# Patient Record
Sex: Female | Born: 1985 | Race: White | Hispanic: No | Marital: Single | Smoking: Never smoker
Health system: Southern US, Community
[De-identification: ages and names within clinical notes are randomized; demographics above are authoritative.]

## PROBLEM LIST (undated history)

## (undated) DIAGNOSIS — F329 Major depressive disorder, single episode, unspecified: Secondary | ICD-10-CM

## (undated) DIAGNOSIS — F419 Anxiety disorder, unspecified: Secondary | ICD-10-CM

## (undated) DIAGNOSIS — F32A Depression, unspecified: Secondary | ICD-10-CM

## (undated) DIAGNOSIS — E282 Polycystic ovarian syndrome: Secondary | ICD-10-CM

## (undated) DIAGNOSIS — D649 Anemia, unspecified: Secondary | ICD-10-CM

## (undated) DIAGNOSIS — J189 Pneumonia, unspecified organism: Secondary | ICD-10-CM

## (undated) DIAGNOSIS — G4730 Sleep apnea, unspecified: Secondary | ICD-10-CM

## (undated) DIAGNOSIS — M797 Fibromyalgia: Secondary | ICD-10-CM

## (undated) DIAGNOSIS — R51 Headache: Secondary | ICD-10-CM

## (undated) DIAGNOSIS — R519 Headache, unspecified: Secondary | ICD-10-CM

## (undated) DIAGNOSIS — J45909 Unspecified asthma, uncomplicated: Secondary | ICD-10-CM

## (undated) DIAGNOSIS — E039 Hypothyroidism, unspecified: Secondary | ICD-10-CM

## (undated) HISTORY — PX: DILATION AND CURETTAGE OF UTERUS: SHX78

## (undated) HISTORY — PX: TONSILLECTOMY: SUR1361

## (undated) HISTORY — DX: Sleep apnea, unspecified: G47.30

## (undated) HISTORY — PX: OTHER SURGICAL HISTORY: SHX169

---

## 2016-05-26 ENCOUNTER — Emergency Department (HOSPITAL_COMMUNITY)
Admission: EM | Admit: 2016-05-26 | Discharge: 2016-05-26 | Disposition: A | Discharge status: Home / Self Care | Payer: Managed Care, Other (non HMO) | Attending: Emergency Medicine | Admitting: Emergency Medicine

## 2016-05-26 ENCOUNTER — Encounter (HOSPITAL_COMMUNITY): Payer: Self-pay | Admitting: *Deleted

## 2016-05-26 ENCOUNTER — Emergency Department (HOSPITAL_COMMUNITY): Payer: Managed Care, Other (non HMO)

## 2016-05-26 DIAGNOSIS — J45909 Unspecified asthma, uncomplicated: Secondary | ICD-10-CM | POA: Insufficient documentation

## 2016-05-26 DIAGNOSIS — N390 Urinary tract infection, site not specified: Secondary | ICD-10-CM | POA: Insufficient documentation

## 2016-05-26 DIAGNOSIS — R1032 Left lower quadrant pain: Secondary | ICD-10-CM | POA: Diagnosis present

## 2016-05-26 DIAGNOSIS — R102 Pelvic and perineal pain: Secondary | ICD-10-CM

## 2016-05-26 HISTORY — DX: Unspecified asthma, uncomplicated: J45.909

## 2016-05-26 HISTORY — DX: Polycystic ovarian syndrome: E28.2

## 2016-05-26 LAB — COMPREHENSIVE METABOLIC PANEL
ALK PHOS: 67 U/L (ref 38–126)
ALT: 23 U/L (ref 14–54)
ANION GAP: 5 (ref 5–15)
AST: 23 U/L (ref 15–41)
Albumin: 3.5 g/dL (ref 3.5–5.0)
BILIRUBIN TOTAL: 0.4 mg/dL (ref 0.3–1.2)
BUN: 9 mg/dL (ref 6–20)
CALCIUM: 9.1 mg/dL (ref 8.9–10.3)
CO2: 27 mmol/L (ref 22–32)
CREATININE: 0.92 mg/dL (ref 0.44–1.00)
Chloride: 106 mmol/L (ref 101–111)
Glucose, Bld: 112 mg/dL — ABNORMAL HIGH (ref 65–99)
Potassium: 3.8 mmol/L (ref 3.5–5.1)
Sodium: 138 mmol/L (ref 135–145)
TOTAL PROTEIN: 6.3 g/dL — AB (ref 6.5–8.1)

## 2016-05-26 LAB — CBC
HCT: 40.5 % (ref 36.0–46.0)
HEMOGLOBIN: 13.5 g/dL (ref 12.0–15.0)
MCH: 27.8 pg (ref 26.0–34.0)
MCHC: 33.3 g/dL (ref 30.0–36.0)
MCV: 83.5 fL (ref 78.0–100.0)
PLATELETS: 258 10*3/uL (ref 150–400)
RBC: 4.85 MIL/uL (ref 3.87–5.11)
RDW: 13.6 % (ref 11.5–15.5)
WBC: 7.9 10*3/uL (ref 4.0–10.5)

## 2016-05-26 LAB — URINALYSIS, ROUTINE W REFLEX MICROSCOPIC
Bilirubin Urine: NEGATIVE
Glucose, UA: NEGATIVE mg/dL
HGB URINE DIPSTICK: NEGATIVE
Ketones, ur: NEGATIVE mg/dL
NITRITE: NEGATIVE
PROTEIN: NEGATIVE mg/dL
Specific Gravity, Urine: 1.019 (ref 1.005–1.030)
pH: 5.5 (ref 5.0–8.0)

## 2016-05-26 LAB — URINE MICROSCOPIC-ADD ON

## 2016-05-26 LAB — POC URINE PREG, ED: PREG TEST UR: NEGATIVE

## 2016-05-26 LAB — LIPASE, BLOOD: Lipase: 22 U/L (ref 11–51)

## 2016-05-26 MED ORDER — KETOROLAC TROMETHAMINE 30 MG/ML IJ SOLN
30.0000 mg | Freq: Once | INTRAMUSCULAR | Status: AC
  Administered 2016-05-26: 30 mg via INTRAMUSCULAR
  Filled 2016-05-26: qty 1

## 2016-05-26 MED ORDER — NAPROXEN 500 MG PO TABS: 500.0000 mg | ORAL_TABLET | Freq: Two times a day (BID) | ORAL | Status: DC

## 2016-05-26 MED ORDER — CEPHALEXIN 500 MG PO CAPS: 500.0000 mg | ORAL_CAPSULE | Freq: Two times a day (BID) | ORAL | Status: DC

## 2016-05-26 NOTE — ED Provider Notes (Signed)
CSN: [id number]     Arrival date & time 05/26/16  1454 History   First MD Initiated Contact with Patient 05/26/16 1608     Chief Complaint  Patient presents with  . Abdominal Pain   (Consider location/radiation/quality/duration/timing/severity/associated sxs/prior Treatment) HPI 30 y.o. female with a hx of PCOS, presents to the Emergency Department today complaining of left sided abdominal pain/pelvic pain with onset this afternoon around lunch time. Pt states that she was sitting comfortably when she felt the pain. States pain feels like a cramping sensation that is pulsating in the LLQ. Pain is 10/10. No relief with OTC remedies. Pt states that "it feels like my ovary exploded." No hx of the same. Recently finished clomid due to infertility. Pt LMP 2 weeks ago. No vaginal bleeding currently. No discharge. No dysuria. No CP/SOB. No N/V/D. No headaches. No fevers. No other symptoms noted.    Past Medical History  Diagnosis Date  . Asthma   . Polycystic ovary    History reviewed. No pertinent past surgical history. No family history on file. Social History  Substance Use Topics  . Smoking status: Never Smoker   . Smokeless tobacco: None  . Alcohol Use: No   OB History    No data available     Review of Systems ROS reviewed and all are negative for acute change except as noted in the HPI.  Allergies  Review of patient's allergies indicates no known allergies.  Home Medications   Prior to Admission medications   Not on File   BP 147/92 mmHg  Pulse 70  Temp(Src) 98 F (36.7 C) (Oral)  Resp 18  Ht 5\' 7"  (1.702 m)  Wt 125.465 kg  BMI 43.31 kg/m2  SpO2 97%  LMP 05/26/2016   Physical Exam  Constitutional: She is oriented to person, place, and time. She appears well-developed and well-nourished.  HENT:  Head: Normocephalic and atraumatic.  Eyes: EOM are normal. Pupils are equal, round, and reactive to light.  Neck: Normal range of motion. Neck supple. No tracheal  deviation present.  Cardiovascular: Normal rate, regular rhythm, normal heart sounds and intact distal pulses.   No murmur heard. Pulmonary/Chest: Effort normal and breath sounds normal. No respiratory distress. She has no wheezes. She has no rales. She exhibits no tenderness.  Abdominal: Soft. Normal appearance and bowel sounds are normal. There is no tenderness. There is no rigidity, no rebound, no guarding, no tenderness at McBurney's point and negative Murphy's sign.  Abdomen soft.   Musculoskeletal: Normal range of motion.  Neurological: She is alert and oriented to person, place, and time.  Skin: Skin is warm and dry.  Psychiatric: She has a normal mood and affect. Her behavior is normal. Thought content normal.  Nursing note and vitals reviewed.  ED Course  Procedures (including critical care time) Labs Review Labs Reviewed  COMPREHENSIVE METABOLIC PANEL - Abnormal; Notable for the following:    Glucose, Bld 112 (*)    Total Protein 6.3 (*)    All other components within normal limits  LIPASE, BLOOD  CBC  URINALYSIS, ROUTINE W REFLEX MICROSCOPIC (NOT AT [facility])  POC URINE PREG, ED   Imaging Review [place] Transvaginal Non-ob  05/26/2016  CLINICAL DATA:  Pelvic pain for 3 days. EXAM: TRANSABDOMINAL AND TRANSVAGINAL ULTRASOUND OF PELVIS DOPPLER ULTRASOUND OF OVARIES TECHNIQUE: Both transabdominal and transvaginal ultrasound examinations of the pelvis were performed. Transabdominal technique was performed for global imaging of the pelvis including uterus, ovaries, adnexal regions, and pelvic cul-de-sac. It  was necessary to proceed with endovaginal exam following the transabdominal exam to visualize the endometrium and ovaries. Color and duplex Doppler ultrasound was utilized to evaluate blood flow to the ovaries. COMPARISON:  None. FINDINGS: Uterus Measurements: 8.1 x 4.4 x 5.6 cm. No fibroids or other mass visualized. Endometrium Thickness: 12.2 mm.  No focal abnormality visualized. Right  ovary Measurements: 5.1 x 3.2 x 4.2 cm. Contains multiple cysts/follicles. Left ovary Measurements: 3.6 x 3.6 x 3.1 cm. Contains a 2.7 cm follicle. Pulsed Doppler evaluation of both ovaries demonstrates normal low-resistance arterial and venous waveforms. Other findings No abnormal free fluid. IMPRESSION: Multiple follicles in the right ovary and a single 2.7 cm follicle in the left ovary. No acute abnormalities. Electronically Signed   By: Gerome Sam III M.D   On: 05/26/2016 18:30   US Pelvis Complete  05/26/2016  CLINICAL DATA:  Pelvic pain for 3 days. EXAM: TRANSABDOMINAL AND TRANSVAGINAL ULTRASOUND OF PELVIS DOPPLER ULTRASOUND OF OVARIES TECHNIQUE: Both transabdominal and transvaginal ultrasound examinations of the pelvis were performed. Transabdominal technique was performed for global imaging of the pelvis including uterus, ovaries, adnexal regions, and pelvic cul-de-sac. It was necessary to proceed with endovaginal exam following the transabdominal exam to visualize the endometrium and ovaries. Color and duplex Doppler ultrasound was utilized to evaluate blood flow to the ovaries. COMPARISON:  None. FINDINGS: Uterus Measurements: 8.1 x 4.4 x 5.6 cm. No fibroids or other mass visualized. Endometrium Thickness: 12.2 mm.  No focal abnormality visualized. Right ovary Measurements: 5.1 x 3.2 x 4.2 cm. Contains multiple cysts/follicles. Left ovary Measurements: 3.6 x 3.6 x 3.1 cm. Contains a 2.7 cm follicle. Pulsed Doppler evaluation of both ovaries demonstrates normal low-resistance arterial and venous waveforms. Other findings No abnormal free fluid. IMPRESSION: Multiple follicles in the right ovary and a single 2.7 cm follicle in the left ovary. No acute abnormalities. Electronically Signed   By: Gerome Sam III M.D   On: 05/26/2016 18:30   [place] Art/ven Flow Abd Pelv Doppler  05/26/2016  CLINICAL DATA:  Pelvic pain for 3 days. EXAM: TRANSABDOMINAL AND TRANSVAGINAL ULTRASOUND OF PELVIS DOPPLER  ULTRASOUND OF OVARIES TECHNIQUE: Both transabdominal and transvaginal ultrasound examinations of the pelvis were performed. Transabdominal technique was performed for global imaging of the pelvis including uterus, ovaries, adnexal regions, and pelvic cul-de-sac. It was necessary to proceed with endovaginal exam following the transabdominal exam to visualize the endometrium and ovaries. Color and duplex Doppler ultrasound was utilized to evaluate blood flow to the ovaries. COMPARISON:  None. FINDINGS: Uterus Measurements: 8.1 x 4.4 x 5.6 cm. No fibroids or other mass visualized. Endometrium Thickness: 12.2 mm.  No focal abnormality visualized. Right ovary Measurements: 5.1 x 3.2 x 4.2 cm. Contains multiple cysts/follicles. Left ovary Measurements: 3.6 x 3.6 x 3.1 cm. Contains a 2.7 cm follicle. Pulsed Doppler evaluation of both ovaries demonstrates normal low-resistance arterial and venous waveforms. Other findings No abnormal free fluid. IMPRESSION: Multiple follicles in the right ovary and a single 2.7 cm follicle in the left ovary. No acute abnormalities. Electronically Signed   By: Gerome Sam III M.D   On: 05/26/2016 18:30   I have personally reviewed and evaluated these images and lab results as part of my medical decision-making.   EKG Interpretation None      MDM  I have reviewed and evaluated the relevant laboratory values I have reviewed and evaluated the relevant imaging studies.   I have reviewed the relevant previous healthcare records. I obtained HPI from historian.  ED Course:  Assessment: Pt is a 29yF with hx PCOS who presents with abdominal/pelvic pain this afternoon in LLQ. Pt concern for ovary. No vaginal bleeding/discharge. No fevers. LMP 2 weeks ago. On exam, pt in NAD. Nontoxic/nonseptic appearing. VSS. Afebrile. Lungs CTA. Heart RRR. Abdomen nontender soft. Concern for Torsion. Ultrasound unremarkable. No Torsion. Multiple follicles on right ovary with since follicle on left  measuring 2.7cm. Labs unremarkable. No leukocytosis. Pregnancy negative. UA with WBC and bacteria. Will treat for UTI since symptomatic. Possible mature follicle with ovulation? Given analgesia in ED. Plan is to DC home with follow up to GYN. At time of discharge, Patient is in no acute distress. Vital Signs are stable. Patient is able to ambulate. Patient able to tolerate PO.    Disposition/Plan:  DC Home Additional Verbal discharge instructions given and discussed with patient.  Pt Instructed to f/u with GYN in the next week for evaluation and treatment of symptoms. Return precautions given Pt acknowledges and agrees with plan  Supervising Physician Loren Raceravid Yelverton, MD   Final diagnoses:  Pelvic pain in female  UTI (lower urinary tract infection)      Audry Piliyler Jasani Lengel, PA-C 05/26/16 2014  Loren Raceravid Yelverton, MD 05/27/16 [id number]

## 2016-05-26 NOTE — ED Notes (Signed)
The pt has had abd pain since she finished clomid  She is in the middle of her period  Her pain has increased today with diarrhea.  She has ahistory of chronic pain and polycyctic ovarian disease  Lm,p now

## 2016-05-26 NOTE — ED Notes (Signed)
Patient transported to Ultrasound 

## 2016-05-26 NOTE — Discharge Instructions (Signed)
Please read and follow all provided instructions.  Your diagnoses today include:  1. UTI (lower urinary tract infection)   2. Pelvic pain in female    Tests performed today include:  Vital signs. See below for your results today.   Medications prescribed:   Take as prescribed   Home care instructions:  Follow any educational materials contained in this packet.  Follow-up instructions: Please follow-up with your GYN for further evaluation of symptoms and treatment   Return instructions:   Please return to the Emergency Department if you do not get better, if you get worse, or new symptoms OR  - Fever (temperature greater than 101.67F)  - Bleeding that does not stop with holding pressure to the area    -Severe pain (please note that you may be more sore the day after your accident)  - Chest Pain  - Difficulty breathing  - Severe nausea or vomiting  - Inability to tolerate food and liquids  - Passing out  - Skin becoming red around your wounds  - Change in mental status (confusion or lethargy)  - New numbness or weakness     Please return if you have any other emergent concerns.  Additional Information:  Your vital signs today were: BP 103/57 mmHg   Pulse 81   Temp(Src) 98 F (36.7 C) (Oral)   Resp 18   Ht 5\' 7"  (1.702 m)   Wt 125.465 kg   BMI 43.31 kg/m2   SpO2 98%   LMP 05/26/2016 If your blood pressure (BP) was elevated above 135/85 this visit, please have this repeated by your doctor within one month. ---------------    CLINICAL DATA: Pelvic pain for 3 days.  EXAM: TRANSABDOMINAL AND TRANSVAGINAL ULTRASOUND OF PELVIS  DOPPLER ULTRASOUND OF OVARIES  TECHNIQUE: Both transabdominal and transvaginal ultrasound examinations of the pelvis were performed. Transabdominal technique was performed for global imaging of the pelvis including uterus, ovaries, adnexal regions, and pelvic cul-de-sac.  It was necessary to proceed with endovaginal exam following  the transabdominal exam to visualize the endometrium and ovaries. Color and duplex Doppler ultrasound was utilized to evaluate blood flow to the ovaries.  COMPARISON: None.  FINDINGS: Uterus  Measurements: 8.1 x 4.4 x 5.6 cm. No fibroids or other mass visualized.  Endometrium  Thickness: 12.2 mm. No focal abnormality visualized.  Right ovary  Measurements: 5.1 x 3.2 x 4.2 cm. Contains multiple cysts/follicles.  Left ovary  Measurements: 3.6 x 3.6 x 3.1 cm. Contains a 2.7 cm follicle.  Pulsed Doppler evaluation of both ovaries demonstrates normal low-resistance arterial and venous waveforms.  Other findings  No abnormal free fluid.  IMPRESSION: Multiple follicles in the right ovary and a single 2.7 cm follicle in the left ovary. No acute abnormalities.

## 2018-01-22 ENCOUNTER — Other Ambulatory Visit: Payer: Self-pay

## 2018-01-22 ENCOUNTER — Encounter (HOSPITAL_COMMUNITY): Payer: Self-pay | Admitting: Emergency Medicine

## 2018-01-22 ENCOUNTER — Ambulatory Visit (HOSPITAL_COMMUNITY)
Admission: EM | Admit: 2018-01-22 | Discharge: 2018-01-22 | Disposition: A | Discharge status: Home / Self Care | Payer: 59 | Attending: Family Medicine | Admitting: Family Medicine

## 2018-01-22 DIAGNOSIS — L309 Dermatitis, unspecified: Secondary | ICD-10-CM | POA: Diagnosis not present

## 2018-01-22 MED ORDER — PREDNISONE 10 MG (21) PO TBPK: ORAL_TABLET | Freq: Every day | ORAL | 0 refills | Status: DC

## 2018-01-22 NOTE — ED Triage Notes (Signed)
Rash to arms x 2 days.  Rash is itchy at times.    Patient reports chest burning, vision blurry  Today, intermittent

## 2018-01-23 NOTE — ED Provider Notes (Signed)
  [facility]   [id number] 01/22/18 Arrival Time: 1145  ASSESSMENT & PLAN:  1. Dermatitis     Meds ordered this encounter  Medications  . predniSONE (STERAPRED UNI-PAK 21 TAB) 10 MG (21) TBPK tablet    Sig: Take by mouth daily. Take as directed.    Dispense:  21 tablet    Refill:  0   Will f/u if not seeing signficant improvement within the next few days. Benadryl if needed.  Reviewed expectations re: course of current medical issues. Questions answered. Outlined signs and symptoms indicating need for more acute intervention. Patient verbalized understanding. After Visit Summary given.   SUBJECTIVE:  Michelle Daniel is a 32 y.o. female who presents with complaint of:   Rash Patient presents for evaluation of a rash involving her upper extremities. Gradual onset. Noticed two days ago. Much itching. No pain. Afebrile. No known new exposures/products. No OTC treatment. No specific aggravating or alleviating factors reported. No recent travel.  ROS: As per HPI.  OBJECTIVE: Vitals:   01/22/18 1317  BP: 125/86  Pulse: 76  Resp: 20  Temp: 97.6 F (36.4 C)  TempSrc: Oral  SpO2: 100%    General appearance: alert; no distress Lungs: clear to auscultation bilaterally Heart: regular rate and rhythm Extremities: no edema Skin: warm and dry; fexor surfaces of upper extremities with diffuse eczematous skin chnages; erythematous; irregular distribution with some linearity; some confluence Psychological: alert and cooperative; normal mood and affect  Allergies  Allergen Reactions  . Sumatriptan     unknown    Past Medical History:  Diagnosis Date  . Asthma   . Polycystic ovary    Social History   Socioeconomic History  . Marital status: Single    Spouse name: Not on file  . Number of children: Not on file  . Years of education: Not on file  . Highest education level: Not on file  Social Needs  . Financial resource strain: Not on file  . Food insecurity  - worry: Not on file  . Food insecurity - inability: Not on file  . Transportation needs - medical: Not on file  . Transportation needs - non-medical: Not on file  Occupational History  . Not on file  Tobacco Use  . Smoking status: Never Smoker  Substance and Sexual Activity  . Alcohol use: No  . Drug use: Not on file  . Sexual activity: Not on file  Other Topics Concern  . Not on file  Social History Narrative  . Not on file   Family History  Problem Relation Age of Onset  . Healthy Mother    History reviewed. No pertinent surgical history.   Mardella LaymanHagler, Wilhelmenia Addis, MD 01/23/18 [phone]

## 2018-06-04 ENCOUNTER — Other Ambulatory Visit (HOSPITAL_COMMUNITY): Payer: Self-pay | Admitting: General Surgery

## 2018-06-21 ENCOUNTER — Other Ambulatory Visit: Payer: Self-pay

## 2018-06-21 ENCOUNTER — Other Ambulatory Visit (HOSPITAL_COMMUNITY): Payer: 59

## 2018-06-21 ENCOUNTER — Ambulatory Visit (HOSPITAL_COMMUNITY)
Admission: RE | Admit: 2018-06-21 | Discharge: 2018-06-21 | Disposition: A | Discharge status: Home / Self Care | Payer: 59 | Source: Ambulatory Visit | Attending: General Surgery | Admitting: General Surgery

## 2018-07-09 ENCOUNTER — Encounter: Payer: Self-pay | Admitting: Registered"

## 2018-07-09 ENCOUNTER — Encounter: Payer: 59 | Attending: General Surgery | Admitting: Registered"

## 2018-07-09 DIAGNOSIS — Z713 Dietary counseling and surveillance: Secondary | ICD-10-CM | POA: Diagnosis not present

## 2018-07-09 DIAGNOSIS — G4733 Obstructive sleep apnea (adult) (pediatric): Secondary | ICD-10-CM | POA: Insufficient documentation

## 2018-07-09 DIAGNOSIS — G43909 Migraine, unspecified, not intractable, without status migrainosus: Secondary | ICD-10-CM | POA: Diagnosis not present

## 2018-07-09 DIAGNOSIS — E669 Obesity, unspecified: Secondary | ICD-10-CM

## 2018-07-09 DIAGNOSIS — E282 Polycystic ovarian syndrome: Secondary | ICD-10-CM | POA: Diagnosis not present

## 2018-07-09 DIAGNOSIS — E039 Hypothyroidism, unspecified: Secondary | ICD-10-CM | POA: Insufficient documentation

## 2018-07-09 NOTE — Progress Notes (Signed)
Pre-Op Assessment Visit: Pre-Operative Sleeve Gastrectomy Surgery  Medical Nutrition Therapy:  Appt start time: 3:33  End time: 4:50  Patient was seen on 07/09/2018 for Pre-Operative Nutrition Assessment. Assessment and letter of approval [facility] coordinator on 07/09/2018.   Pt expectation of surgery: better manage conditions such as fibromyalgia, PCOS; improve quality of life, prevent heart attack/stroke, to exercise more, ride roller coasters; ~170 lbs  Pt expectation of Dietitian: none stated  Start weight at [facility]: 294.7 BMI: 47.57   Pt states she does not like to cook a lot because she gets tired easily. Pt states she is limited with physical activity but will swim. Pt states he is hoping surgery will help with PCOS. Pt states she eats out a lot and prefers to make easy meals. Pt states she does not have time to be in the kitchen because she has 3 dogs, works all day, and due to her conditions. 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 Pt states she has pea protein that she enjoys (30 grams of protein and less than 5 grams of carbohydrates).   Per insurance, pt needs 3 SWL visits prior to surgery at least 30 days apart.    24 hr Dietary Recall: First Meal: fast food-oatmeal + fruit or cereal or chicken biscuit Snack: sometimes rice cakes or almonds Second Meal: taco or country fried chicken + okra + mashed potatoes + roll Snack: none Third Meal: sometimes skips; soup,  Snack: none Beverages: water, sweet tea, Propel, soda (2-3x/week)  Encouraged to engage in 75 minutes of moderate physical activity including cardiovascular and weight baring weekly  Handouts given during visit include:  . Pre-Op Goals . Bariatric Surgery Protein Shakes . Vitamin and Mineral Recommendations  During the appointment today the following Pre-Op Goals were reviewed with the patient: . Track your food and  beverage: MyFitness Pal or Baritastic App . Make healthy food choices . Begin to limit portion sizes . Limited concentrated sugars and fried foods . Keep fat/sugar in the single digits per serving on             food labels . Practice CHEWING your food  (aim for 30 chews per bite or until applesauce consistency) . Practice not drinking 15 minutes before, during, and 30 minutes after each meal/snack . Avoid all carbonated beverages  . Avoid/limit caffeinated beverages  . Avoid all sugar-sweetened beverages . Avoid alcohol . Consume 3 meals per day; eat every 3-5 hours . Make a list of non-food related activities . Aim for 64-100 ounces of FLUID daily  . Aim for at least 60-80 grams of PROTEIN daily . Look for a liquid protein source that contain ?15 g protein and ?5 g carbohydrate  (ex: shakes, drinks, shots) . Physical activity is an important part of a healthy lifestyle so keep it moving!  Follow diet recommendations listed below Energy and Macronutrient Recommendations: Calories: 1600 Carbohydrate: 180 Protein: 120 Fat: 44  Demonstrated degree of understanding via:  Teach Back   Teaching Method Utilized:  Visual Auditory Hands on  Barriers to learning/adherence to lifestyle change: contemplative stage of change  Patient to call the Nutrition [facility] Education [facility] to enroll in Pre-Op and Post-Op Nutrition Education when surgery date is scheduled.

## 2018-08-09 ENCOUNTER — Encounter: Payer: 59 | Attending: General Surgery | Admitting: Registered"

## 2018-08-09 ENCOUNTER — Encounter: Payer: Self-pay | Admitting: Registered"

## 2018-08-09 DIAGNOSIS — Z713 Dietary counseling and surveillance: Secondary | ICD-10-CM | POA: Insufficient documentation

## 2018-08-09 DIAGNOSIS — E039 Hypothyroidism, unspecified: Secondary | ICD-10-CM | POA: Insufficient documentation

## 2018-08-09 DIAGNOSIS — G43909 Migraine, unspecified, not intractable, without status migrainosus: Secondary | ICD-10-CM | POA: Diagnosis not present

## 2018-08-09 DIAGNOSIS — G4733 Obstructive sleep apnea (adult) (pediatric): Secondary | ICD-10-CM | POA: Insufficient documentation

## 2018-08-09 DIAGNOSIS — E282 Polycystic ovarian syndrome: Secondary | ICD-10-CM | POA: Insufficient documentation

## 2018-08-09 DIAGNOSIS — E669 Obesity, unspecified: Secondary | ICD-10-CM

## 2018-08-09 NOTE — Progress Notes (Signed)
Sleeve Gastrectomy Appt start time: 10:25 end time: 10:45  Assessment: 1st SWL Appointment.   Start Wt at [facility]: 294.7 Wt: 286.4 BMI: 46.23   Pt arrives having 8.3 lbs from previous visit.    Pt states she has been doing well taking medications in morning. Pt states metformin has decreased breakfast appetite and she sometimes skips dinner. Pt states she has been trying to make healthier choices; less fast food. Pt states she has had less inflammation helping her to exercise more. Pt states she has increased physical activity. Pt states she likes Premier Protein (peaches and cream), Muscle Milk (chocolate and strawberry), and sometimes makes her own shakes using pea protein. Pt states she drinks sweet tea at least 10 times a week and Sprite 4-5 times a week and understands the need to eliminate sugar-sweetened beverage intake for bariatric surgery. Pt has made significant changes in preparation for surgery.   Per insurance, pt needs 3 SWL visits prior to surgery at least 30 days apart.   MEDICATIONS: See list   DIETARY INTAKE:  24-hr recall:  B ( AM): yogurt  Snk ( AM): none  L ( PM): salad + grilled chicken or steak + carrots + mashed potatoes Snk ( PM): none D ( PM): sometimes skips; fast food or peas + mashed potatoes + steak or meatloaf + vegetables or chicken sandwich Snk ( PM): none Beverages: water, sweet tea or sprite (sometimes)  Usual physical activity: walking dog (20-30 min), water exercises (60 min), cleaning at home  Diet to Follow: 1600 calories 180 g carbohydrates 120 g protein 44 g fat  Preferred Learning Style:   No preference indicated   Learning Readiness:   Ready  Change in progress     Nutritional Diagnosis:  [place]-3.3 Overweight/obesity related to past poor dietary habits and physical inactivity as evidenced by patient w/ planned sleeve gastrectomy surgery following dietary guidelines for continued weight loss.    Intervention:  Nutrition  counseling for upcoming Bariatric Surgery.  Goals:  - Aim for 150 minutes of physical activity including cardio and weight bearing every week - Aim to at least 3 meals a day. Try not to skip meals.  - Aim to decrease sweet tea intake to once every other day.  - Track physical activity. Aim for at 30 minutes, 5 days/week.   Teaching Method Utilized:  Visual Auditory Hands on  Handouts given during visit include:  none  Barriers to learning/adherence to lifestyle change: none identified  Demonstrated degree of understanding via:  Teach Back   Monitoring/Evaluation:  Dietary intake, exercise, and body weight in 1 month(s).

## 2018-08-09 NOTE — Patient Instructions (Addendum)
-   Aim to at least 3 meals a day. Try not to skip meals.   - Aim to decrease sweet tea intake to once every other day.   - Track physical activity. Aim for at 30 minutes, 5 days/week.

## 2018-09-05 ENCOUNTER — Ambulatory Visit (INDEPENDENT_AMBULATORY_CARE_PROVIDER_SITE_OTHER): Payer: 59 | Admitting: Psychiatry

## 2018-09-05 DIAGNOSIS — F509 Eating disorder, unspecified: Secondary | ICD-10-CM | POA: Diagnosis not present

## 2018-09-09 ENCOUNTER — Encounter: Payer: Self-pay | Admitting: Skilled Nursing Facility1

## 2018-09-09 ENCOUNTER — Encounter: Payer: 59 | Attending: General Surgery | Admitting: Skilled Nursing Facility1

## 2018-09-09 DIAGNOSIS — Z713 Dietary counseling and surveillance: Secondary | ICD-10-CM | POA: Diagnosis not present

## 2018-09-09 DIAGNOSIS — E669 Obesity, unspecified: Secondary | ICD-10-CM

## 2018-09-09 DIAGNOSIS — E039 Hypothyroidism, unspecified: Secondary | ICD-10-CM | POA: Diagnosis not present

## 2018-09-09 DIAGNOSIS — E282 Polycystic ovarian syndrome: Secondary | ICD-10-CM | POA: Diagnosis not present

## 2018-09-09 DIAGNOSIS — G4733 Obstructive sleep apnea (adult) (pediatric): Secondary | ICD-10-CM | POA: Diagnosis not present

## 2018-09-09 DIAGNOSIS — G43909 Migraine, unspecified, not intractable, without status migrainosus: Secondary | ICD-10-CM | POA: Diagnosis not present

## 2018-09-09 NOTE — Progress Notes (Signed)
Sleeve Gastrectomy Appt start time: 10:25 end time: 10:45  Assessment: 1st SWL Appointment.   Start Wt at [facility]: 294.7 Wt: 287 BMI: 46.32   Pt arrives having maintained weight from last visit.   Per insurance, pt needs 3 SWL visits prior to surgery at least 30 days apart.   Pt states she has eliminated carbonated beverages and has been reducing her sweet tea. Pt states she aims to have 1 piece of fruit a day. Pt states if she eats a fast food she only eats half of it. Pt states she takes a advantages of days she is not have flare up of her fibromyalgia. 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 Pt states she has been working on chewing well which has been trouble duet o bite and missing teeth. Pt states she is very committed to the changes for surgery. Pt states she gets migraines often. Pt states she wants to start tracking her foods/symptoms/thoughts and try some different protein shakes and getting back into swimming and not drinking with meals.  MEDICATIONS: See list   DIETARY INTAKE:  24-hr recall:  B ( AM): yogurt  Snk ( AM): none  L ( PM): salad + grilled chicken or steak + carrots + mashed potatoes Snk ( PM): fruit D ( PM): sometimes skips; fast food or peas + mashed potatoes + steak or meatloaf + vegetables or chicken sandwich Snk ( PM): rice cakes or popcorn Beverages: water, sweet tea or sprite (sometimes)  Usual physical activity: walking dog (20-30 min), water exercises (60 min), cleaning at home  Diet to Follow: 1600 calories 180 g carbohydrates 120 g protein 44 g fat    Nutritional Diagnosis:  [place]-3.3 Overweight/obesity related to past poor dietary habits and physical inactivity as evidenced by patient w/ planned sleeve gastrectomy surgery following dietary guidelines for continued weight loss.    Intervention:  Nutrition counseling for upcoming Bariatric Surgery.  Goals:  - Aim for 150 minutes of physical activity including  cardio and weight bearing every week -Listed in assessment   Teaching Method Utilized:  Visual Auditory Hands on  Handouts given during visit include:  none  Barriers to learning/adherence to lifestyle change: none identified  Demonstrated degree of understanding via:  Teach Back   Monitoring/Evaluation:  Dietary intake, exercise, and body weight in 1 month(s).

## 2018-10-07 ENCOUNTER — Encounter: Payer: Self-pay | Admitting: Skilled Nursing Facility1

## 2018-10-07 ENCOUNTER — Encounter: Payer: 59 | Attending: General Surgery | Admitting: Skilled Nursing Facility1

## 2018-10-07 ENCOUNTER — Ambulatory Visit: Payer: Self-pay | Admitting: Skilled Nursing Facility1

## 2018-10-07 DIAGNOSIS — E039 Hypothyroidism, unspecified: Secondary | ICD-10-CM | POA: Insufficient documentation

## 2018-10-07 DIAGNOSIS — G43909 Migraine, unspecified, not intractable, without status migrainosus: Secondary | ICD-10-CM | POA: Insufficient documentation

## 2018-10-07 DIAGNOSIS — E282 Polycystic ovarian syndrome: Secondary | ICD-10-CM | POA: Diagnosis not present

## 2018-10-07 DIAGNOSIS — Z713 Dietary counseling and surveillance: Secondary | ICD-10-CM | POA: Insufficient documentation

## 2018-10-07 DIAGNOSIS — E669 Obesity, unspecified: Secondary | ICD-10-CM

## 2018-10-07 DIAGNOSIS — G4733 Obstructive sleep apnea (adult) (pediatric): Secondary | ICD-10-CM | POA: Insufficient documentation

## 2018-10-07 NOTE — Progress Notes (Signed)
Sleeve Gastrectomy Appt start time: 10:25 end time: 10:45  Assessment: 1st SWL Appointment.   Start Wt at [facility]: 294.7 Wt: 286 BMI: 46.23   Per insurance, pt needs 3 SWL visits prior to surgery at least 30 days apart.   Pt arrives having lost about 1 pound. Pt states she has tried premier protein shakes without issues. Pt states she has started replacing her afternoon meals with a protein shake: (Dietitian educated pt on the need to focus on a whole foods approach without skipping meals) Pt states she will continue to replace one meal with a  Protein shake due to medical conditions (unsure what pt is meaning). Pt states she has been working on eating smaller portions and taking more time to eat. Pt states she has been working on not drinking with meals which has been difficult.    MEDICATIONS: See list   DIETARY INTAKE:  24-hr recall:  B ( AM): yogurt  Snk ( AM): none  L ( PM): protein shake Snk ( PM): fruit D ( PM): sometimes skips; fast food or peas + mashed potatoes + steak or meatloaf + vegetables or chicken sandwich Snk ( PM): rice cakes or popcorn Beverages: 2 Liters water, sweet tea   Usual physical activity: walking dog (20-30 min), water exercises (60 min), cleaning at home  Diet to Follow: 1600 calories 180 g carbohydrates 120 g protein 44 g fat    Nutritional Diagnosis:  [place]-3.3 Overweight/obesity related to past poor dietary habits and physical inactivity as evidenced by patient w/ planned sleeve gastrectomy surgery following dietary guidelines for continued weight loss.    Intervention:  Nutrition counseling for upcoming Bariatric Surgery.  Goals:  - Aim for 150 minutes of physical activity including cardio and weight bearing every week -Continue to work on not drinking with meals   Teaching Method Utilized:  Visual Auditory Hands on  Handouts given during visit include:  none  Barriers to learning/adherence to lifestyle change: none  identified  Demonstrated degree of understanding via:  Teach Back   Monitoring/Evaluation:  Dietary intake, exercise, and body weight in 1 month(s).

## 2018-10-08 ENCOUNTER — Ambulatory Visit: Payer: 59 | Admitting: Psychiatry

## 2018-10-08 ENCOUNTER — Ambulatory Visit (INDEPENDENT_AMBULATORY_CARE_PROVIDER_SITE_OTHER): Payer: 59 | Admitting: Psychiatry

## 2018-10-08 DIAGNOSIS — F509 Eating disorder, unspecified: Secondary | ICD-10-CM | POA: Diagnosis not present

## 2018-10-21 ENCOUNTER — Encounter: Payer: Self-pay | Admitting: Skilled Nursing Facility1

## 2018-10-21 ENCOUNTER — Encounter: Payer: 59 | Admitting: Skilled Nursing Facility1

## 2018-10-21 DIAGNOSIS — E669 Obesity, unspecified: Secondary | ICD-10-CM

## 2018-10-21 NOTE — Progress Notes (Signed)
Pre-Operative Nutrition Class:  Appt start time: 4037   End time:  1830.  Patient was seen on 10/21/2018 for Pre-Operative Bariatric Surgery [facility]   Surgery date:  Surgery type: sleeve Start weight at [facility]: 294.6 Weight today: 284.9  Samples given per MNT protocol. Patient educated on appropriate usage: Bariatric Advantage Multivitamin Lot #[id number] Exp:10/20  Bariatric Advantage Calcium  Lot #[id number] Exp:Dec 30 2019  Premier  Protein Shake Lot # [id number] Exp: March 05 2019  The following the learning objectives were met by the patient during this course:  Identify Pre-Op Dietary Goals and will begin 2 weeks pre-operatively  Identify appropriate sources of fluids and proteins   State protein recommendations and appropriate sources pre and post-operatively  Identify Post-Operative Dietary Goals and will follow for 2 weeks post-operatively  Identify appropriate multivitamin and calcium sources  Describe the need for physical activity post-operatively and will follow MD recommendations  State when to call healthcare provider regarding medication questions or post-operative complications  Handouts given during class include:  Pre-Op Bariatric Surgery Diet Handout  Protein Shake Handout  Post-Op Bariatric Surgery Nutrition Handout  BELT Program Information Flyer  Support Group Information Flyer  [facility] Bariatric Supplements Price List  Follow-Up Plan: Patient will follow-up at [facility] 2 weeks post operatively for diet advancement per MD.

## 2018-10-29 NOTE — Progress Notes (Signed)
Need orders in epic.  Preop on 10/30/2018.

## 2018-10-29 NOTE — Patient Instructions (Signed)
Michelle Daniel  10/29/2018   Your procedure is scheduled on: 11/04/2018   [facility]  [facility]  Report to admitting at   0745 AM    Call this number if you have problems the morning of surgery [phone]   Remember: Do not eat food or drink liquids :After Midnight. BRUSH YOUR TEETH MORNING OF SURGERY AND RINSE YOUR MOUTH OUT, NO CHEWING GUM CANDY OR MINTS.     Take these medicines the morning of surgery with A SIP OF WATER: Cymbalta, Synthroid, Protonix                                 You may not have any metal on your body including hair pins and              piercings  Do not wear jewelry, make-up, lotions, powders or perfumes, deodorant             Do not wear nail polish.  Do not shave  48 hours prior to surgery.                 Do not bring valuables to the hospital. [place] IS NOT             RESPONSIBLE   FOR VALUABLES.  Contacts, dentures or bridgework may not be worn into surgery.  Leave suitcase in the car. After surgery it may be brought to your room.                       Please read over the following fact sheets you were given: _____________________________________________________________________             [facility] - Pre[facility] Before surgery, you can play an important role.  Because skin is not sterile, your skin needs to be as free of germs as possible.  You can reduce the number of germs on your skin by washing with CHG (chlorahexidine gluconate) soap before surgery.  CHG is an antiseptic cleaner which kills germs and bonds with the skin to continue killing germs even after washing. Please DO NOT use if you have an allergy to CHG or antibacterial soaps.  If your skin becomes reddened/irritated stop using the CHG and inform your nurse when you arrive at Short Stay. Do not shave (including legs and underarms) for at least 48 hours prior to the first CHG shower.  You may shave your face/neck. Please  follow these instructions carefully:  1.  Shower with CHG Soap the night before surgery and the  morning of Surgery.  2.  If you choose to wash your hair, wash your hair first as usual with your  normal  shampoo.  3.  After you shampoo, rinse your hair and body thoroughly to remove the  shampoo.                           4.  Use CHG as you would any other liquid soap.  You can apply chg directly  to the skin and wash                       Gently with a scrungie or clean washcloth.  5.  Apply the CHG Soap to your  body ONLY FROM THE NECK DOWN.   Do not use on face/ open                           Wound or open sores. Avoid contact with eyes, ears mouth and genitals (private parts).                       Wash face,  Genitals (private parts) with your normal soap.             6.  Wash thoroughly, paying special attention to the area where your surgery  will be performed.  7.  Thoroughly rinse your body with warm water from the neck down.  8.  DO NOT shower/wash with your normal soap after using and rinsing off  the CHG Soap.                9.  Pat yourself dry with a clean towel.            10.  Wear clean pajamas.            11.  Place clean sheets on your bed the night of your first shower and do not  sleep with pets. Day of Surgery : Do not apply any lotions/deodorants the morning of surgery.  Please wear clean clothes to the hospital/surgery center.  FAILURE TO FOLLOW THESE INSTRUCTIONS MAY RESULT IN THE CANCELLATION OF YOUR SURGERY PATIENT SIGNATURE_________________________________  NURSE SIGNATURE__________________________________  ________________________________________________________________________

## 2018-10-30 ENCOUNTER — Encounter (HOSPITAL_COMMUNITY)
Admission: RE | Admit: 2018-10-30 | Discharge: 2018-10-30 | Disposition: A | Discharge status: Home / Self Care | Payer: 59 | Source: Ambulatory Visit | Attending: General Surgery | Admitting: General Surgery

## 2018-10-30 ENCOUNTER — Encounter (HOSPITAL_COMMUNITY): Payer: Self-pay

## 2018-10-30 ENCOUNTER — Other Ambulatory Visit: Payer: Self-pay

## 2018-10-30 DIAGNOSIS — Z01812 Encounter for preprocedural laboratory examination: Secondary | ICD-10-CM | POA: Diagnosis present

## 2018-10-30 HISTORY — DX: Anxiety disorder, unspecified: F41.9

## 2018-10-30 HISTORY — DX: Pneumonia, unspecified organism: J18.9

## 2018-10-30 HISTORY — DX: Major depressive disorder, single episode, unspecified: F32.9

## 2018-10-30 HISTORY — DX: Headache, unspecified: R51.9

## 2018-10-30 HISTORY — DX: Headache: R51

## 2018-10-30 HISTORY — DX: Depression, unspecified: F32.A

## 2018-10-30 HISTORY — DX: Hypothyroidism, unspecified: E03.9

## 2018-10-30 HISTORY — DX: Anemia, unspecified: D64.9

## 2018-10-30 HISTORY — DX: Fibromyalgia: M79.7

## 2018-10-30 LAB — CBC
HCT: 43.8 % (ref 36.0–46.0)
Hemoglobin: 14.5 g/dL (ref 12.0–15.0)
MCH: 29.1 pg (ref 26.0–34.0)
MCHC: 33.1 g/dL (ref 30.0–36.0)
MCV: 88 fL (ref 80.0–100.0)
Platelets: 277 10*3/uL (ref 150–400)
RBC: 4.98 MIL/uL (ref 3.87–5.11)
RDW: 13.3 % (ref 11.5–15.5)
WBC: 7.6 10*3/uL (ref 4.0–10.5)
nRBC: 0 % (ref 0.0–0.2)

## 2018-10-30 LAB — BASIC METABOLIC PANEL
Anion gap: 9 (ref 5–15)
BUN: 12 mg/dL (ref 6–20)
CHLORIDE: 104 mmol/L (ref 98–111)
CO2: 27 mmol/L (ref 22–32)
Calcium: 9.2 mg/dL (ref 8.9–10.3)
Creatinine, Ser: 0.95 mg/dL (ref 0.44–1.00)
GFR calc Af Amer: >60 mL/min (ref >60)
GFR calc non Af Amer: >60 mL/min (ref >60)
GLUCOSE: 94 mg/dL (ref 70–99)
Potassium: 4.2 mmol/L (ref 3.5–5.1)
Sodium: 140 mmol/L (ref 135–145)

## 2018-10-30 LAB — HCG, SERUM, QUALITATIVE: Preg, Serum: NEGATIVE

## 2018-10-30 NOTE — Progress Notes (Signed)
06/21/2018-ekg-epic  cxr-06/21/18-epic

## 2018-11-01 ENCOUNTER — Ambulatory Visit: Payer: Self-pay | Admitting: General Surgery

## 2018-11-01 NOTE — Progress Notes (Signed)
Called triage at [facility] for orders for 11/04/18 surgery.

## 2018-11-01 NOTE — Progress Notes (Signed)
Spoke with Tresa EndoKelly in Triage at [facility] and asked her to get a message to DR Kinsinger that patient surgery is on 11/04/2018 and MD has not yet put orders in .  Patient has already been seen on preop and labs were done by anesthesia guidelines.

## 2018-11-03 MED ORDER — BUPIVACAINE LIPOSOME 1.3 % IJ SUSP
20.0000 mL | INTRAMUSCULAR | Status: DC
  Filled 2018-11-03: qty 20

## 2018-11-04 ENCOUNTER — Inpatient Hospital Stay (HOSPITAL_COMMUNITY)
Admission: RE | Admit: 2018-11-04 | Discharge: 2018-11-05 | DRG: 621 | Disposition: A | Discharge status: Home / Self Care | Payer: 59 | Attending: General Surgery | Admitting: General Surgery

## 2018-11-04 ENCOUNTER — Other Ambulatory Visit: Payer: Self-pay

## 2018-11-04 ENCOUNTER — Encounter (HOSPITAL_COMMUNITY)
Admission: RE | Disposition: A | Discharge status: Home / Self Care | Payer: Self-pay | Source: Home / Self Care | Attending: General Surgery

## 2018-11-04 ENCOUNTER — Encounter (HOSPITAL_COMMUNITY): Payer: Self-pay

## 2018-11-04 ENCOUNTER — Inpatient Hospital Stay (HOSPITAL_COMMUNITY): Payer: 59 | Admitting: Registered Nurse

## 2018-11-04 DIAGNOSIS — Z823 Family history of stroke: Secondary | ICD-10-CM

## 2018-11-04 DIAGNOSIS — Z792 Long term (current) use of antibiotics: Secondary | ICD-10-CM

## 2018-11-04 DIAGNOSIS — Z6841 Body Mass Index (BMI) 40.0 and over, adult: Secondary | ICD-10-CM | POA: Diagnosis not present

## 2018-11-04 DIAGNOSIS — Z888 Allergy status to other drugs, medicaments and biological substances status: Secondary | ICD-10-CM

## 2018-11-04 DIAGNOSIS — Z79890 Hormone replacement therapy (postmenopausal): Secondary | ICD-10-CM | POA: Diagnosis not present

## 2018-11-04 DIAGNOSIS — Z8249 Family history of ischemic heart disease and other diseases of the circulatory system: Secondary | ICD-10-CM | POA: Diagnosis not present

## 2018-11-04 DIAGNOSIS — Z79899 Other long term (current) drug therapy: Secondary | ICD-10-CM

## 2018-11-04 DIAGNOSIS — F329 Major depressive disorder, single episode, unspecified: Secondary | ICD-10-CM | POA: Diagnosis present

## 2018-11-04 DIAGNOSIS — K219 Gastro-esophageal reflux disease without esophagitis: Secondary | ICD-10-CM | POA: Diagnosis present

## 2018-11-04 DIAGNOSIS — Z825 Family history of asthma and other chronic lower respiratory diseases: Secondary | ICD-10-CM

## 2018-11-04 DIAGNOSIS — E039 Hypothyroidism, unspecified: Secondary | ICD-10-CM | POA: Diagnosis present

## 2018-11-04 DIAGNOSIS — E669 Obesity, unspecified: Secondary | ICD-10-CM | POA: Diagnosis present

## 2018-11-04 DIAGNOSIS — Z833 Family history of diabetes mellitus: Secondary | ICD-10-CM | POA: Diagnosis not present

## 2018-11-04 DIAGNOSIS — G4730 Sleep apnea, unspecified: Secondary | ICD-10-CM | POA: Diagnosis present

## 2018-11-04 HISTORY — PX: LAPAROSCOPIC GASTRIC SLEEVE RESECTION: SHX5895

## 2018-11-04 LAB — CBC WITH DIFFERENTIAL/PLATELET
Abs Immature Granulocytes: 0.04 10*3/uL (ref 0.00–0.07)
Basophils Absolute: 0 10*3/uL (ref 0.0–0.1)
Basophils Relative: 0 %
EOS PCT: 1 %
Eosinophils Absolute: 0.1 10*3/uL (ref 0.0–0.5)
HCT: 42 % (ref 36.0–46.0)
Hemoglobin: 13.6 g/dL (ref 12.0–15.0)
Immature Granulocytes: 1 %
Lymphocytes Relative: 17 %
Lymphs Abs: 1.4 10*3/uL (ref 0.7–4.0)
MCH: 29.1 pg (ref 26.0–34.0)
MCHC: 32.4 g/dL (ref 30.0–36.0)
MCV: 89.7 fL (ref 80.0–100.0)
Monocytes Absolute: 0.2 10*3/uL (ref 0.1–1.0)
Monocytes Relative: 2 %
Neutro Abs: 6.6 10*3/uL (ref 1.7–7.7)
Neutrophils Relative %: 79 %
Platelets: 206 10*3/uL (ref 150–400)
RBC: 4.68 MIL/uL (ref 3.87–5.11)
RDW: 13.5 % (ref 11.5–15.5)
WBC: 8.4 10*3/uL (ref 4.0–10.5)
nRBC: 0 % (ref 0.0–0.2)

## 2018-11-04 LAB — ABO/RH: ABO/RH(D): O POS

## 2018-11-04 LAB — COMPREHENSIVE METABOLIC PANEL
ALT: 30 U/L (ref 0–44)
AST: 26 U/L (ref 15–41)
Albumin: 3.9 g/dL (ref 3.5–5.0)
Alkaline Phosphatase: 57 U/L (ref 38–126)
Anion gap: 9 (ref 5–15)
BUN: 9 mg/dL (ref 6–20)
CO2: 25 mmol/L (ref 22–32)
Calcium: 8.6 mg/dL — ABNORMAL LOW (ref 8.9–10.3)
Chloride: 108 mmol/L (ref 98–111)
Creatinine, Ser: 1.03 mg/dL — ABNORMAL HIGH (ref 0.44–1.00)
GFR calc Af Amer: >60 mL/min (ref >60)
GFR calc non Af Amer: >60 mL/min (ref >60)
GLUCOSE: 132 mg/dL — AB (ref 70–99)
Potassium: 4.2 mmol/L (ref 3.5–5.1)
Sodium: 142 mmol/L (ref 135–145)
TOTAL PROTEIN: 6.3 g/dL — AB (ref 6.5–8.1)
Total Bilirubin: 0.6 mg/dL (ref 0.3–1.2)

## 2018-11-04 LAB — TYPE AND SCREEN
ABO/RH(D): O POS
Antibody Screen: NEGATIVE

## 2018-11-04 SURGERY — GASTRECTOMY, SLEEVE, LAPAROSCOPIC
Anesthesia: General | Site: Abdomen

## 2018-11-04 MED ORDER — LACTATED RINGERS IV SOLN
INTRAVENOUS | Status: DC | PRN
  Administered 2018-11-04: 09:00:00 via INTRAVENOUS

## 2018-11-04 MED ORDER — PROPOFOL 10 MG/ML IV BOLUS
INTRAVENOUS | Status: DC | PRN
  Administered 2018-11-04: 200 mg via INTRAVENOUS

## 2018-11-04 MED ORDER — FENTANYL CITRATE (PF) 100 MCG/2ML IJ SOLN
INTRAMUSCULAR | Status: DC | PRN
  Administered 2018-11-04: 25 ug via INTRAVENOUS
  Administered 2018-11-04: 100 ug via INTRAVENOUS
  Administered 2018-11-04: 25 ug via INTRAVENOUS

## 2018-11-04 MED ORDER — PROPOFOL 10 MG/ML IV BOLUS
INTRAVENOUS | Status: AC
  Filled 2018-11-04: qty 40

## 2018-11-04 MED ORDER — DEXTROSE-NACL 5-0.45 % IV SOLN
INTRAVENOUS | Status: DC
  Administered 2018-11-04 – 2018-11-05 (×2): via INTRAVENOUS

## 2018-11-04 MED ORDER — ACETAMINOPHEN 160 MG/5ML PO SOLN
650.0000 mg | Freq: Four times a day (QID) | ORAL | Status: DC
  Administered 2018-11-04 – 2018-11-05 (×3): 650 mg via ORAL
  Filled 2018-11-04 (×4): qty 20.3

## 2018-11-04 MED ORDER — MIDAZOLAM HCL 5 MG/5ML IJ SOLN
INTRAMUSCULAR | Status: DC | PRN
  Administered 2018-11-04: 2 mg via INTRAVENOUS

## 2018-11-04 MED ORDER — ACETAMINOPHEN 500 MG PO TABS
1000.0000 mg | ORAL_TABLET | ORAL | Status: AC
  Administered 2018-11-04: 1000 mg via ORAL
  Filled 2018-11-04: qty 2

## 2018-11-04 MED ORDER — DEXAMETHASONE SODIUM PHOSPHATE 10 MG/ML IJ SOLN
INTRAMUSCULAR | Status: DC | PRN
  Administered 2018-11-04: 8 mg via INTRAVENOUS

## 2018-11-04 MED ORDER — LIDOCAINE 2% (20 MG/ML) 5 ML SYRINGE
INTRAMUSCULAR | Status: AC
  Filled 2018-11-04: qty 5

## 2018-11-04 MED ORDER — OXYCODONE HCL 5 MG/5ML PO SOLN
5.0000 mg | ORAL | Status: DC | PRN
  Administered 2018-11-04 – 2018-11-05 (×4): 5 mg via ORAL
  Filled 2018-11-04 (×4): qty 5

## 2018-11-04 MED ORDER — 0.9 % SODIUM CHLORIDE (POUR BTL) OPTIME
TOPICAL | Status: DC | PRN
  Administered 2018-11-04: 1000 mL

## 2018-11-04 MED ORDER — FENTANYL CITRATE (PF) 100 MCG/2ML IJ SOLN
INTRAMUSCULAR | Status: AC
  Filled 2018-11-04: qty 2

## 2018-11-04 MED ORDER — ROCURONIUM BROMIDE 10 MG/ML (PF) SYRINGE
PREFILLED_SYRINGE | INTRAVENOUS | Status: DC | PRN
  Administered 2018-11-04: 50 mg via INTRAVENOUS
  Administered 2018-11-04: 20 mg via INTRAVENOUS

## 2018-11-04 MED ORDER — GABAPENTIN 300 MG PO CAPS
300.0000 mg | ORAL_CAPSULE | ORAL | Status: AC
  Administered 2018-11-04: 300 mg via ORAL
  Filled 2018-11-04: qty 1

## 2018-11-04 MED ORDER — ENSURE PRE-SURGERY PO LIQD
296.0000 mL | Freq: Once | ORAL | Status: DC
  Filled 2018-11-04: qty 296

## 2018-11-04 MED ORDER — SUGAMMADEX SODIUM 500 MG/5ML IV SOLN
INTRAVENOUS | Status: DC | PRN
  Administered 2018-11-04: 250 mg via INTRAVENOUS

## 2018-11-04 MED ORDER — PHENYLEPHRINE 40 MCG/ML (10ML) SYRINGE FOR IV PUSH (FOR BLOOD PRESSURE SUPPORT)
PREFILLED_SYRINGE | INTRAVENOUS | Status: DC | PRN
  Administered 2018-11-04: 80 ug via INTRAVENOUS

## 2018-11-04 MED ORDER — SIMETHICONE 80 MG PO CHEW: 80.0000 mg | CHEWABLE_TABLET | Freq: Four times a day (QID) | ORAL | Status: DC | PRN

## 2018-11-04 MED ORDER — LIDOCAINE HCL 2 % IJ SOLN
INTRAMUSCULAR | Status: AC
  Filled 2018-11-04: qty 20

## 2018-11-04 MED ORDER — MORPHINE SULFATE (PF) 4 MG/ML IV SOLN
INTRAVENOUS | Status: AC
  Filled 2018-11-04: qty 1

## 2018-11-04 MED ORDER — ONDANSETRON HCL 4 MG/2ML IJ SOLN
INTRAMUSCULAR | Status: AC
  Filled 2018-11-04: qty 2

## 2018-11-04 MED ORDER — EPHEDRINE 5 MG/ML INJ
INTRAVENOUS | Status: AC
  Filled 2018-11-04: qty 10

## 2018-11-04 MED ORDER — GABAPENTIN 100 MG PO CAPS
200.0000 mg | ORAL_CAPSULE | Freq: Two times a day (BID) | ORAL | Status: DC
  Administered 2018-11-04 – 2018-11-05 (×2): 200 mg via ORAL
  Filled 2018-11-04 (×2): qty 2

## 2018-11-04 MED ORDER — LACTATED RINGERS IR SOLN
Status: DC | PRN
  Administered 2018-11-04: 1000 mL

## 2018-11-04 MED ORDER — FENTANYL CITRATE (PF) 100 MCG/2ML IJ SOLN
25.0000 ug | INTRAMUSCULAR | Status: DC | PRN
  Administered 2018-11-04 (×3): 50 ug via INTRAVENOUS

## 2018-11-04 MED ORDER — ROCURONIUM BROMIDE 10 MG/ML (PF) SYRINGE
PREFILLED_SYRINGE | INTRAVENOUS | Status: AC
  Filled 2018-11-04: qty 10

## 2018-11-04 MED ORDER — BUPIVACAINE LIPOSOME 1.3 % IJ SUSP
INTRAMUSCULAR | Status: DC | PRN
  Administered 2018-11-04: 20 mL

## 2018-11-04 MED ORDER — ONDANSETRON HCL 4 MG/2ML IJ SOLN
INTRAMUSCULAR | Status: DC | PRN
  Administered 2018-11-04: 4 mg via INTRAVENOUS

## 2018-11-04 MED ORDER — LIDOCAINE 2% (20 MG/ML) 5 ML SYRINGE
INTRAMUSCULAR | Status: DC | PRN
  Administered 2018-11-04: 1.5 mg/kg/h via INTRAVENOUS

## 2018-11-04 MED ORDER — DULOXETINE HCL 60 MG PO CPEP
60.0000 mg | ORAL_CAPSULE | Freq: Every day | ORAL | Status: DC
  Administered 2018-11-04 – 2018-11-05 (×2): 60 mg via ORAL
  Filled 2018-11-04 (×2): qty 1

## 2018-11-04 MED ORDER — APREPITANT 40 MG PO CAPS
40.0000 mg | ORAL_CAPSULE | ORAL | Status: AC
  Administered 2018-11-04: 40 mg via ORAL
  Filled 2018-11-04: qty 1

## 2018-11-04 MED ORDER — LEVOTHYROXINE SODIUM 112 MCG PO TABS
112.0000 ug | ORAL_TABLET | Freq: Every day | ORAL | Status: DC
  Administered 2018-11-05: 112 ug via ORAL
  Filled 2018-11-04: qty 1

## 2018-11-04 MED ORDER — SCOPOLAMINE 1 MG/3DAYS TD PT72
1.0000 | MEDICATED_PATCH | TRANSDERMAL | Status: DC
  Administered 2018-11-04: 1.5 mg via TRANSDERMAL
  Filled 2018-11-04: qty 1

## 2018-11-04 MED ORDER — MORPHINE SULFATE (PF) 4 MG/ML IV SOLN
1.0000 mg | INTRAVENOUS | Status: DC | PRN
  Administered 2018-11-04 (×2): 2 mg via INTRAVENOUS
  Administered 2018-11-04: 3 mg via INTRAVENOUS
  Filled 2018-11-04: qty 1

## 2018-11-04 MED ORDER — ENOXAPARIN SODIUM 30 MG/0.3ML ~~LOC~~ SOLN
30.0000 mg | Freq: Two times a day (BID) | SUBCUTANEOUS | Status: DC
  Administered 2018-11-04 – 2018-11-05 (×2): 30 mg via SUBCUTANEOUS
  Filled 2018-11-04 (×2): qty 0.3

## 2018-11-04 MED ORDER — CHLORHEXIDINE GLUCONATE 4 % EX LIQD: 60.0000 mL | Freq: Once | CUTANEOUS | Status: DC

## 2018-11-04 MED ORDER — SODIUM CHLORIDE 0.9 % IV SOLN
2.0000 g | INTRAVENOUS | Status: AC
  Administered 2018-11-04: 2 g via INTRAVENOUS
  Filled 2018-11-04: qty 2

## 2018-11-04 MED ORDER — PHENYLEPHRINE 40 MCG/ML (10ML) SYRINGE FOR IV PUSH (FOR BLOOD PRESSURE SUPPORT)
PREFILLED_SYRINGE | INTRAVENOUS | Status: AC
  Filled 2018-11-04: qty 10

## 2018-11-04 MED ORDER — MIDAZOLAM HCL 2 MG/2ML IJ SOLN
INTRAMUSCULAR | Status: AC
  Filled 2018-11-04: qty 2

## 2018-11-04 MED ORDER — HYDRALAZINE HCL 20 MG/ML IJ SOLN: 10.0000 mg | INTRAMUSCULAR | Status: DC | PRN

## 2018-11-04 MED ORDER — BUPIVACAINE HCL 0.25 % IJ SOLN
INTRAMUSCULAR | Status: DC | PRN
  Administered 2018-11-04: 30 mL

## 2018-11-04 MED ORDER — DEXAMETHASONE SODIUM PHOSPHATE 4 MG/ML IJ SOLN: 4.0000 mg | INTRAMUSCULAR | Status: DC

## 2018-11-04 MED ORDER — HEPARIN SODIUM (PORCINE) 5000 UNIT/ML IJ SOLN
5000.0000 [IU] | INTRAMUSCULAR | Status: AC
  Administered 2018-11-04: 5000 [IU] via SUBCUTANEOUS
  Filled 2018-11-04: qty 1

## 2018-11-04 MED ORDER — STERILE WATER FOR IRRIGATION IR SOLN
Status: DC | PRN
  Administered 2018-11-04: 1000 mL

## 2018-11-04 MED ORDER — FENTANYL CITRATE (PF) 250 MCG/5ML IJ SOLN
INTRAMUSCULAR | Status: AC
  Filled 2018-11-04: qty 5

## 2018-11-04 MED ORDER — FAMOTIDINE IN NACL 20-0.9 MG/50ML-% IV SOLN
20.0000 mg | Freq: Two times a day (BID) | INTRAVENOUS | Status: DC
  Administered 2018-11-04 – 2018-11-05 (×2): 20 mg via INTRAVENOUS
  Filled 2018-11-04 (×3): qty 50

## 2018-11-04 MED ORDER — EPHEDRINE SULFATE-NACL 50-0.9 MG/10ML-% IV SOSY
PREFILLED_SYRINGE | INTRAVENOUS | Status: DC | PRN
  Administered 2018-11-04: 5 mg via INTRAVENOUS

## 2018-11-04 MED ORDER — LACTATED RINGERS IV SOLN: INTRAVENOUS | Status: DC

## 2018-11-04 MED ORDER — KETAMINE HCL 10 MG/ML IJ SOLN
INTRAMUSCULAR | Status: DC | PRN
  Administered 2018-11-04 (×2): 20 mg via INTRAVENOUS

## 2018-11-04 MED ORDER — LIDOCAINE 2% (20 MG/ML) 5 ML SYRINGE
INTRAMUSCULAR | Status: DC | PRN
  Administered 2018-11-04: 100 mg via INTRAVENOUS

## 2018-11-04 MED ORDER — ONDANSETRON HCL 4 MG/2ML IJ SOLN
4.0000 mg | INTRAMUSCULAR | Status: DC | PRN
  Administered 2018-11-04: 4 mg via INTRAVENOUS
  Filled 2018-11-04 (×2): qty 2

## 2018-11-04 MED ORDER — DEXAMETHASONE SODIUM PHOSPHATE 10 MG/ML IJ SOLN
INTRAMUSCULAR | Status: AC
  Filled 2018-11-04: qty 1

## 2018-11-04 MED ORDER — PREMIER PROTEIN SHAKE
2.0000 [oz_av] | ORAL | Status: DC
  Administered 2018-11-05 (×4): 2 [oz_av] via ORAL

## 2018-11-04 MED ORDER — BUPIVACAINE HCL (PF) 0.25 % IJ SOLN
INTRAMUSCULAR | Status: AC
  Filled 2018-11-04: qty 30

## 2018-11-04 SURGICAL SUPPLY — 53 items
APPLIER CLIP ROT 13.4 12 LRG (CLIP) ×3
BAG LAPAROSCOPIC 12 15 PORT 16 (BASKET) ×1 IMPLANT
BAG RETRIEVAL 12/15 (BASKET) ×2
BAG RETRIEVAL 12/15MM (BASKET) ×1
BANDAGE ADH SHEER 1  50/CT (GAUZE/BANDAGES/DRESSINGS) ×3 IMPLANT
BENZOIN TINCTURE PRP APPL 2/3 (GAUZE/BANDAGES/DRESSINGS) ×3 IMPLANT
BLADE SURG SZ11 CARB STEEL (BLADE) ×3 IMPLANT
CABLE HIGH FREQUENCY MONO STRZ (ELECTRODE) IMPLANT
CHLORAPREP W/TINT 26ML (MISCELLANEOUS) ×3 IMPLANT
CLIP APPLIE ROT 13.4 12 LRG (CLIP) ×1 IMPLANT
CLOSURE WOUND 1/2 X4 (GAUZE/BANDAGES/DRESSINGS) ×1
COVER SURGICAL LIGHT HANDLE (MISCELLANEOUS) ×3 IMPLANT
COVER WAND RF STERILE (DRAPES) ×3 IMPLANT
DRAPE UNIVERSAL PACK (DRAPES) ×3 IMPLANT
DRAPE UTILITY XL STRL (DRAPES) ×6 IMPLANT
ELECT REM PT RETURN 15FT ADLT (MISCELLANEOUS) ×3 IMPLANT
GLOVE BIOGEL PI IND STRL 7.0 (GLOVE) ×1 IMPLANT
GLOVE BIOGEL PI INDICATOR 7.0 (GLOVE) ×2
GLOVE SURG SS PI 7.0 STRL IVOR (GLOVE) ×3 IMPLANT
GOWN STRL REUS W/TWL LRG LVL3 (GOWN DISPOSABLE) ×3 IMPLANT
GOWN STRL REUS W/TWL XL LVL3 (GOWN DISPOSABLE) ×9 IMPLANT
GRASPER SUT TROCAR 14GX15 (MISCELLANEOUS) ×3 IMPLANT
HOVERMATT SINGLE USE (MISCELLANEOUS) IMPLANT
KIT BASIN OR (CUSTOM PROCEDURE TRAY) ×3 IMPLANT
MARKER SKIN DUAL TIP RULER LAB (MISCELLANEOUS) ×3 IMPLANT
NEEDLE SPNL 22GX3.5 QUINCKE BK (NEEDLE) ×3 IMPLANT
RELOAD STAPLER BLUE 60MM (STAPLE) ×3 IMPLANT
RELOAD STAPLER GOLD 60MM (STAPLE) ×1 IMPLANT
RELOAD STAPLER GREEN 60MM (STAPLE) ×1 IMPLANT
SCISSORS LAP 5X45 EPIX DISP (ENDOMECHANICALS) IMPLANT
SET IRRIG TUBING LAPAROSCOPIC (IRRIGATION / IRRIGATOR) ×3 IMPLANT
SHEARS HARMONIC ACE PLUS 45CM (MISCELLANEOUS) ×3 IMPLANT
SLEEVE GASTRECTOMY 40FR VISIGI (MISCELLANEOUS) ×3 IMPLANT
SLEEVE XCEL OPT CAN 5 100 (ENDOMECHANICALS) ×6 IMPLANT
SOLUTION ANTI FOG 6CC (MISCELLANEOUS) ×3 IMPLANT
SPONGE LAP 18X18 RF (DISPOSABLE) ×3 IMPLANT
STAPLER ECHELON LONG 60 440 (INSTRUMENTS) ×3 IMPLANT
STAPLER RELOAD BLUE 60MM (STAPLE) ×9
STAPLER RELOAD GOLD 60MM (STAPLE) ×3
STAPLER RELOAD GREEN 60MM (STAPLE) ×3
STRIP CLOSURE SKIN 1/2X4 (GAUZE/BANDAGES/DRESSINGS) ×2 IMPLANT
SUT ETHIBOND 0 36 GRN (SUTURE) IMPLANT
SUT MNCRL AB 4-0 PS2 18 (SUTURE) ×3 IMPLANT
SUT VICRYL 0 TIES 12 18 (SUTURE) ×3 IMPLANT
SYR 20CC LL (SYRINGE) ×3 IMPLANT
SYR 50ML LL SCALE MARK (SYRINGE) ×3 IMPLANT
TOWEL OR 17X26 10 PK STRL BLUE (TOWEL DISPOSABLE) ×3 IMPLANT
TOWEL OR NON WOVEN STRL DISP B (DISPOSABLE) ×3 IMPLANT
TROCAR BLADELESS 15MM (ENDOMECHANICALS) ×3 IMPLANT
TROCAR BLADELESS OPT 5 100 (ENDOMECHANICALS) ×3 IMPLANT
TUBING CONNECTING 10 (TUBING) ×2 IMPLANT
TUBING CONNECTING 10' (TUBING) ×1
TUBING INSUF HEATED (TUBING) ×3 IMPLANT

## 2018-11-04 NOTE — Progress Notes (Signed)
Patient sitting in chair sleepy, walked in hallway.  Unable to void.  Nurse to follow up with patient.  Questions to answer

## 2018-11-04 NOTE — Discharge Instructions (Signed)
° ° ° °GASTRIC BYPASS/SLEEVE ° Home Care Instructions ° ° These instructions are to help you care for yourself when you go home. ° °Call: If you have any problems. °• Call [phone] and ask for the surgeon on call °• If you need immediate help, come to the ER at [place].  °• Tell the ER staff that you are a new post-op gastric bypass or gastric sleeve patient °  °Signs and symptoms to report: • Severe vomiting or nausea °o If you cannot keep down clear liquids for longer than 1 day, call your surgeon  °• Abdominal pain that does not get better after taking your pain medication °• Fever over 100.4° F with chills °• Heart beating over 100 beats a minute °• Shortness of breath at rest °• Chest pain °•  Redness, swelling, drainage, or foul odor at incision (surgical) sites °•  If your incisions open or pull apart °• Swelling or pain in calf (lower leg) °• Diarrhea (Loose bowel movements that happen often), frequent watery, uncontrolled bowel movements °• Constipation, (no bowel movements for 3 days) if this happens: Pick one °o Milk of Magnesia, 2 tablespoons by mouth, 3 times a day for 2 days if needed °o Stop taking Milk of Magnesia once you have a bowel movement °o Call your doctor if constipation continues °Or °o Miralax  (instead of Milk of Magnesia) following the label instructions °o Stop taking Miralax once you have a bowel movement °o Call your doctor if constipation continues °• Anything you think is not normal °  °Normal side effects after surgery: • Unable to sleep at night or unable to focus °• Irritability or moody °• Being tearful (crying) or depressed °These are common complaints, possibly related to your anesthesia medications that put you to sleep, stress of surgery, and change in lifestyle.  This usually goes away a few weeks after surgery.  If these feelings continue, call your primary care doctor. °  °Wound Care: You may have surgical glue, steri-strips, or staples over your incisions after  surgery °• Surgical glue:  Looks like a clear film over your incisions and will wear off a little at a time °• Steri-strips: Strips of tape over your incisions. You may notice a yellowish color on the skin under the steri-strips. This is used to make the   steri-strips stick better. Do not pull the steri-strips off - let them fall off °• Staples: Staples may be removed before you leave the hospital °o If you go home with staples, call [facility], [phone] at for an appointment with your surgeon’s nurse to have staples removed 10 days after surgery. °• Showering: You may shower two (2) days after your surgery unless your surgeon tells you differently °o Wash gently around incisions with warm soapy water, rinse well, and gently pat dry  °o No tub baths until staples are removed, steri-strips fall off or glue is gone.  °  °Medications: • Medications should be liquid or crushed if larger than the size of a dime °• Extended release pills (medication that release a little bit at a time through the day) should NOT be crushed or cut. (examples include XL, ER, DR, SR) °• Depending on the size and number of medications you take, you may need to space (take a few throughout the day)/change the time you take your medications so that you do not over-fill your pouch (smaller stomach) °• Make sure you follow-up with your primary care doctor to   make medication changes needed during rapid weight loss and life-style changes °• If you have diabetes, follow up with the doctor that orders your diabetes medication(s) within one week after surgery and check your blood sugar regularly. °• Do not drive while taking prescription pain medication  °• It is ok to take Tylenol by the bottle instructions with your pain medicine or instead of your pain medicine as needed.  DO NOT TAKE NSAIDS (EXAMPLES OF NSAIDS:  IBUPROFREN/ NAPROXEN)  °Diet:                    First 2 Weeks ° You will see the dietician t about two (2) weeks  after your surgery. The dietician will increase the types of foods you can eat if you are handling liquids well: °• If you have severe vomiting or nausea and cannot keep down clear liquids lasting longer than 1 day, call your surgeon @ ([phone]) °Protein Shake °• Drink at least 2 ounces of shake 5-6 times per day °• Each serving of protein shakes (usually 8 - 12 ounces) should have: °o 15 grams of protein  °o And no more than 5 grams of carbohydrate  °• Goal for protein each day: °o Men = 80 grams per day °o Women = 60 grams per day °• Protein powder may be added to fluids such as non-fat milk or Lactaid milk or unsweetened Soy/Almond milk (limit to 35 grams added protein powder per serving) ° °Hydration °• Slowly increase the amount of water and other clear liquids as tolerated (See Acceptable Fluids) °• Slowly increase the amount of protein shake as tolerated  °•  Sip fluids slowly and throughout the day.  Do not use straws. °• May use sugar substitutes in small amounts (no more than 6 - 8 packets per day; i.e. Splenda) ° °Fluid Goal °• The first goal is to drink at least 8 ounces of protein shake/drink per day (or as directed by the nutritionist); some examples of protein shakes are Syntrax Nectar, Adkins Advantage, EAS Edge HP, and Unjury. See handout from pre-op Bariatric Education Class: °o Slowly increase the amount of protein shake you drink as tolerated °o You may find it easier to slowly sip shakes throughout the day °o It is important to get your proteins in first °• Your fluid goal is to drink 64 - 100 ounces of fluid daily °o It may take a few weeks to build up to this °• 32 oz (or more) should be clear liquids  °And  °• 32 oz (or more) should be full liquids (see below for examples) °• Liquids should not contain sugar, caffeine, or carbonation ° °Clear Liquids: °• Water or Sugar-free flavored water (i.e. Fruit H2O, Propel) °• Decaffeinated coffee or tea (sugar-free) °• Crystal Lite, Wyler’s Lite,  Minute Maid Lite °• Sugar-free Jell-O °• Bouillon or broth °• Sugar-free Popsicle:   *Less than 20 calories each; Limit 1 per day ° °Full Liquids: °Protein Shakes/Drinks + 2 choices per day of other full liquids °• Full liquids must be: °o No More Than 15 grams of Carbs per serving  °o No More Than 3 grams of Fat per serving °• Strained low-fat cream soup (except Cream of Potato or Tomato) °• Non-Fat milk °• Fat-free Lactaid Milk °• Unsweetened Soy Or Unsweetened Almond Milk °• Low Sugar yogurt (Dannon Lite & Fit, Greek yogurt; Oikos Triple Zero; Chobani Simply 100; Yoplait 100 calorie Greek - No Fruit on the Bottom) ° °  °Vitamins   and Minerals • Start 1 day after surgery unless otherwise directed by your surgeon °• 2 Chewable Bariatric Specific Multivitamin / Multimineral Supplement with iron (Example: Bariatric Advantage Multi EA) °• Chewable Calcium with Vitamin D-3 °(Example: 3 Chewable Calcium Plus 600 with Vitamin D-3) °o Take 500 mg three (3) times a day for a total of 1500 mg each day °o Do not take all 3 doses of calcium at one time as it may cause constipation, and you can only absorb 500 mg  at a time  °o Do not mix multivitamins containing iron with calcium supplements; take 2 hours apart °• Menstruating women and those with a history of anemia (a blood disease that causes weakness) may need extra iron °o Talk with your doctor to see if you need more iron °• Do not stop taking or change any vitamins or minerals until you talk to your dietitian or surgeon °• Your Dietitian and/or surgeon must approve all vitamin and mineral supplements °  °Activity and Exercise: Limit your physical activity as instructed by your doctor.  It is important to continue walking at home.  During this time, use these guidelines: °• Do not lift anything greater than ten (10) pounds for at least two (2) weeks °• Do not go back to work or drive until your surgeon says you can °• You may have sex when you feel comfortable  °o It is  VERY important for female patients to use a reliable birth control method; fertility often increases after surgery  °o All hormonal birth control will be ineffective for 30 days after surgery due to medications given during surgery a barrier method must be used. °o Do not get pregnant for at least 18 months °• Start exercising as soon as your doctor tells you that you can °o Make sure your doctor approves any physical activity °• Start with a simple walking program °• Walk 5-15 minutes each day, 7 days per week.  °• Slowly increase until you are walking 30-45 minutes per day °Consider joining our BELT program. [phone] or email [email or web address] °  °Special Instructions Things to remember: °• Use your CPAP when sleeping if this applies to you ° °• [facility] has two free Bariatric Surgery Support Groups that meet monthly °o The 3rd Thursday of each month, 6 pm, [facility]  °o The 2nd Friday of each month, 11:45 am in the private dining room in the basement of [place] °• It is very important to keep all follow up appointments with your surgeon, dietitian, primary care physician, and behavioral health practitioner °• Routine follow up schedule with your surgeon include appointments at 2-3 weeks, 6-8 weeks, 6 months, and 1 year at a minimum.  Your surgeon may request to see you more often.   °o After the first year, please follow up with your bariatric surgeon and dietitian at least once a year in order to maintain best weight loss results °[facility]: [phone] °[facility]: [phone] °Bariatric Nurse Coordinator: [phone] °  °   Reviewed and Endorsed  °by  Patient Education Committee, June, 2016 °Edits Approved: Aug, 2018 ° ° ° °

## 2018-11-04 NOTE — Op Note (Signed)
Procedure: Upper GI endoscopy  Description of procedure: Upper GI endoscopy is performed at the completion of laparoscopic sleeve gastrectomy by Dr.  Sheliah HatchKinsinger  The video endoscope was introduced into the upper esophagus and then passed to the EG junction at  38 cm. The esophagus appeared normal. The gastric sleeve was entered. The sleeve was tensely distended with air while the outlet was obstructed under saline irrigation by the operating surgeon. There was no evidence of leak. The staple line was intact and without bleeding. The scope was advanced to the antrum and pylorus visualized. There was no stricture or twisting or mucosal abnormality, and particularly no narrowing noted at the incisura.  The pouch was then desufflated and the scope withdrawn.  Mariella SaaBenjamin T Perlie Scheuring MD, FACS  11/04/2018, 11:16 AM

## 2018-11-04 NOTE — H&P (Signed)
Michelle Daniel is an 32 y.o. female.   Chief Complaint: obesity HPI: 32 yo female with long history of obesity. She has completed all requirements and is ready to proceed with bariatric surgery.  Past Medical History:  Diagnosis Date  . Anemia    iron deficiency   . Anxiety   . Asthma   . Depression   . Fibromyalgia   . Headache    chronic migraines   . Hypothyroidism   . Pneumonia    hx of   . Polycystic ovary   . Sleep apnea    cpap- does not know settings     Past Surgical History:  Procedure Laterality Date  . DILATION AND CURETTAGE OF UTERUS    . forehead trauma repair at age 89     . TONSILLECTOMY     and adenoidectomy     Family History  Problem Relation Age of Onset  . Healthy Mother   . Asthma Other   . Cancer Other   . Hypertension Other   . Stroke Other   . Heart disease Other   . Diabetes Other    Social History:  reports that she has never smoked. She has never used smokeless tobacco. She reports that she does not drink alcohol or use drugs.  Allergies:  Allergies  Allergen Reactions  . Sumatriptan Anaphylaxis    unknown    Medications Prior to Admission  Medication Sig Dispense Refill  . amoxicillin (AMOXIL) 500 MG tablet Take 1,000 mg by mouth 2 (two) times daily.    . DULoxetine (CYMBALTA) 60 MG capsule Take 60 mg by mouth daily.     Marland Kitchen. levothyroxine (SYNTHROID, LEVOTHROID) 112 MCG tablet Take 112 mcg by mouth daily before breakfast.     . Multiple Vitamins-Minerals (MULTIVITAMIN WITH MINERALS) tablet Take 1 tablet by mouth daily.    . pantoprazole (PROTONIX) 40 MG tablet Take 40 mg by mouth 2 (two) times daily.    . Calcium Citrate-Vitamin D (CALCIUM + D PO) Take 1 tablet by mouth daily.      No results found for this or any previous visit (from the past 48 hour(s)). No results found.  Review of Systems  Constitutional: Negative for chills and fever.  HENT: Negative for hearing loss.   Eyes: Negative for blurred vision and double vision.   Respiratory: Negative for cough and hemoptysis.   Cardiovascular: Negative for chest pain and palpitations.  Gastrointestinal: Negative for abdominal pain, nausea and vomiting.  Genitourinary: Negative for dysuria and urgency.  Musculoskeletal: Negative for myalgias and neck pain.  Skin: Negative for itching and rash.  Neurological: Negative for dizziness, tingling and headaches.  Endo/Heme/Allergies: Does not bruise/bleed easily.  Psychiatric/Behavioral: Negative for depression and suicidal ideas.    Blood pressure 134/88, pulse 88, temperature 98.2 F (36.8 C), resp. rate 13, height 5\' 8"  (1.727 m), weight 125.6 kg, last menstrual period 10/21/2018, SpO2 97 %. Physical Exam  Vitals reviewed. Constitutional: She is oriented to person, place, and time. She appears well-developed and well-nourished.  HENT:  Head: Normocephalic and atraumatic.  Eyes: Pupils are equal, round, and reactive to light. Conjunctivae and EOM are normal.  Neck: Normal range of motion. Neck supple.  Cardiovascular: Normal rate and regular rhythm.  Respiratory: Effort normal and breath sounds normal.  GI: Soft. Bowel sounds are normal. She exhibits no distension. There is no tenderness.  Musculoskeletal: Normal range of motion.  Neurological: She is alert and oriented to person, place, and time.  Skin:  Skin is warm and dry.  Psychiatric: She has a normal mood and affect. Her behavior is normal.     Assessment/Plan 31 yo female with class III obesity, sleep apnea, PCOS -lap sleeve gastrectomy -bariatric post op protocol -enhanced recovery protocol  Rodman Pickle, MD 11/04/2018, 9:12 AM

## 2018-11-04 NOTE — Anesthesia Procedure Notes (Signed)
Procedure Name: Intubation Date/Time: 11/04/2018 10:17 AM Performed by: Victoriano Lain, CRNA Pre-anesthesia Checklist: Patient identified, Emergency Drugs available, Suction available, Patient being monitored and Timeout performed Patient Re-evaluated:Patient Re-evaluated prior to induction Oxygen Delivery Method: Circle system utilized Preoxygenation: Pre-oxygenation with 100% oxygen Induction Type: IV induction Ventilation: Mask ventilation without difficulty Laryngoscope Size: Mac and 4 Grade View: Grade I Tube type: Oral Tube size: 7.5 mm Number of attempts: 1 Airway Equipment and Method: Stylet Placement Confirmation: ETT inserted through vocal cords under direct vision,  positive ETCO2 and breath sounds checked- equal and bilateral Secured at: 19 cm Tube secured with: Tape Dental Injury: Teeth and Oropharynx as per pre-operative assessment

## 2018-11-04 NOTE — Transfer of Care (Signed)
Immediate Anesthesia Transfer of Care Note  Patient: Michelle Daniel  Procedure(s) Performed: LAPAROSCOPIC GASTRIC SLEEVE RESECTION WITH UPPER ENDO AND ERAS PATHWAY (N/A Abdomen)  Patient Location: PACU  Anesthesia Type:General  Level of Consciousness: awake, alert , oriented and patient cooperative  Airway & Oxygen Therapy: Patient Spontanous Breathing and Patient connected to face mask oxygen  Post-op Assessment: Report given to RN, Post -op Vital signs reviewed and stable and Patient moving all extremities  Post vital signs: Reviewed and stable  Last Vitals:  Vitals Value Taken Time  BP 136/78 11/04/2018 11:47 AM  Temp    Pulse 95 11/04/2018 11:49 AM  Resp 23 11/04/2018 11:49 AM  SpO2 100 % 11/04/2018 11:49 AM  Vitals shown include unvalidated device data.  Last Pain: There were no vitals filed for this visit.       Complications: No apparent anesthesia complications

## 2018-11-04 NOTE — Progress Notes (Addendum)
PHARMACY CONSULT FOR:  Risk Assessment for Post-Discharge VTE Following Bariatric Surgery  Post-Discharge VTE Risk Assessment: This patient's probability of 30-day post-discharge VTE is increased due to the factors marked:   Female    Age >/=60 years    BMI >/=50 kg/m2    CHF    Dyspnea at Rest    Paraplegia  X  Non-gastric-band surgery    Operation Time >/=3 hr    Return to OR     Length of Stay >/= 3 d   Predicted probability of 30-day post-discharge VTE: 0.16%  Other patient-specific factors to consider: NO prior to admission anticoagulation.   Recommendation for Discharge: No pharmacologic prophylaxis post-discharge    Michelle Daniel is a 32 y.o. female who underwent  laparoscopic Sleeve Gastrectomy on 11/04/2018.  Case start: 10:37 Case end: 11:35   Allergies  Allergen Reactions  . Sumatriptan Anaphylaxis    unknown    Patient Measurements: Height: 5\' 8"  (172.7 cm) Weight: 277 lb (125.6 kg) IBW/kg (Calculated) : 63.9 Body mass index is 42.12 kg/m.  Recent Labs    11/04/18 1239  WBC 8.4  HGB 13.6  HCT 42.0  PLT 206  CREATININE 1.03*  ALBUMIN 3.9  PROT 6.3*  AST 26  ALT 30  ALKPHOS 57  BILITOT 0.6   Estimated Creatinine Clearance: 110.7 mL/min (A) (by C-G formula based on SCr of 1.03 mg/dL (H)).    Past Medical History:  Diagnosis Date  . Anemia    iron deficiency   . Anxiety   . Asthma   . Depression   . Fibromyalgia   . Headache    chronic migraines   . Hypothyroidism   . Pneumonia    hx of   . Polycystic ovary   . Sleep apnea    cpap- does not know settings      Medications Prior to Admission  Medication Sig Dispense Refill Last Dose  . amoxicillin (AMOXIL) 500 MG tablet Take 1,000 mg by mouth 2 (two) times daily.   11/03/2018 at Unknown time  . DULoxetine (CYMBALTA) 60 MG capsule Take 60 mg by mouth daily.    11/03/2018 at Unknown time  . levothyroxine (SYNTHROID, LEVOTHROID) 112 MCG tablet Take 112 mcg by mouth daily before  breakfast.    11/04/2018 at 0745  . Multiple Vitamins-Minerals (MULTIVITAMIN WITH MINERALS) tablet Take 1 tablet by mouth daily.   11/03/2018 at Unknown time  . pantoprazole (PROTONIX) 40 MG tablet Take 40 mg by mouth 2 (two) times daily.   11/04/2018 at 0745  . Calcium Citrate-Vitamin D (CALCIUM + D PO) Take 1 tablet by mouth daily.   Unknown at Unknown time     Lynann BeaverChristine Anecia Nusbaum PharmD, BCPS Pager [phone] 11/04/2018 3:41 PM

## 2018-11-04 NOTE — Progress Notes (Signed)
Patient unable to void. In and out cath done for 800 mls clear yellow urine. Lina SarBeth Rishabh Rinkenberger, RN

## 2018-11-04 NOTE — Anesthesia Postprocedure Evaluation (Signed)
Anesthesia Post Note  Patient: Michelle Daniel  Procedure(s) Performed: LAPAROSCOPIC GASTRIC SLEEVE RESECTION WITH UPPER ENDO AND ERAS PATHWAY (N/A Abdomen)     Patient location during evaluation: PACU Anesthesia Type: General Level of consciousness: awake and alert Pain management: pain level controlled Vital Signs Assessment: post-procedure vital signs reviewed and stable Respiratory status: spontaneous breathing, nonlabored ventilation, respiratory function stable and patient connected to nasal cannula oxygen Cardiovascular status: blood pressure returned to baseline and stable Postop Assessment: no apparent nausea or vomiting Anesthetic complications: no    Last Vitals:  Vitals:   11/04/18 1215 11/04/18 1315  BP: (!) 143/96   Pulse:    Resp:    Temp:  36.9 C  SpO2:      Last Pain:  Vitals:   11/04/18 1315  PainSc: 3                  Indiyah Paone L Chrisy Hillebrand

## 2018-11-04 NOTE — Op Note (Signed)
Preop Diagnosis: Obesity Class III  Postop Diagnosis: same  Procedure performed: laparoscopic Sleeve Gastrectomy  Assitant: Jaclynn GuarneriBen Hoxworth  Indications:  The patient is a 32 y.o. year-old morbidly obese female who has been followed in the Bariatric Clinic as an outpatient. This patient was diagnosed with morbid obesity with a BMI of Body mass index is 42.12 kg/m. and significant co-morbidities including sleep apnea.  The patient was counseled extensively in the Bariatric Outpatient Clinic and after a thorough explanation of the risks and benefits of surgery (including death from complications, bowel leak, infection such as peritonitis and/or sepsis, internal hernia, bleeding, need for blood transfusion, bowel obstruction, organ failure, pulmonary embolus, deep venous thrombosis, wound infection, incisional hernia, skin breakdown, and others entailed on the consent form) and after a compliant diet and exercise program, the patient was scheduled for an elective laparoscopic sleeve gastrectomy.  Description of Operation:  Following informed consent, the patient was taken to the operating room and placed on the operating table in the supine position.  She had previously received prophylactic antibiotics and subcutaneous heparin for DVT prophylaxis in the pre-op holding area.  After induction of general endotracheal anesthesia by the anesthesiologist, the patient underwent placement of sequential compression devices and an oro-gastric tube.  A timeout was confirmed by the surgery and anesthesia teams.  The patient was adequately padded at all pressure points and placed on a footboard to prevent slippage from the OR table during extremes of position during surgery.  She underwent a routine sterile prep and drape of her entire abdomen.    Next, A transverse incision was made under the left subcostal area and a 5mm optical viewing trocar was introduced into the peritoneal cavity. Pneumoperitoneum was applied  with a high flow and low pressure. A laparoscope was inserted to confirm placement. A extraperitoneal block was then placed at the lateral abdominal wall using exparel diluted with marcaine. 5 additional incisions were placed: 1 5mm trocar to the left of the midline. 1 additional 5mm trocar in the left lateral area, 1 12mm trocar in the right mid abdomen, 1 5mm trocar in the right subcostal area, and a Nathanson retractor was placed through a subxiphoid incision.  The fat pad at the GE junction was incised and the gastrodiaphragmatic ligament was divided using the Harmonic scalpel. Next, a hole was created through the lesser omentum along the greater curve of the stomach to enter the lesser sac. The vessels along the greater omentum were  Then ligated and divided using the Harmonic scalpel moving towards the spleen and then short gastric vessels were ligated and divided in the same fashion to fully mobilize the fundus. The left crus was identified to ensure completion of the dissection. Next the antrum was measured and dissection continued inferiorly along the greater curve towards the pylorus and stopped 6cm from the pylorus.   A 40Fr ViSiGi dilator was placed into the esophgaus and along the lesser curve of the stomach and placed on suction. 1 non-reinforced 60mm Green load echelon stapler(s) followed by 1 60mm Gold load echelon stapler(s) followed by 3 60mm blue load echelon stapler(s) were used to make the resection along the antrum being sure to stay well away from the angularis by angling the jaws of the stapler towards the greater curve and later completing the resection staying along the ViSiGi and ensuring the fundus was not retained by appropriately retracting it lateral. Air was inserted through the ViSiGi to perform a leak test showing no bubbles and a neutral  lie of the stomach.  The assistant then went and performed an upper endoscopy and leak test. 5 clips were used for one area of bleeding on  the staple line at the second/third firing area. No bubbles were seen and the sleeve and antrum distended appropriately. The specimen was then placed in an endocatch bag and removed by the 15mm port. The fascia of the 15mm port was closed with a 0 vicryl by suture passer. Hemostasis was ensured. Pneumoperitoneum was evacuated, all ports were removed and all incisions closed with 4-0 monocryl suture in subcuticular fashion. Steristrips and bandaids were put in place for dressing. The patient awoke from anesthesia and was brought to pacu in stable condition. All counts were correct.  Estimated blood loss: <85ml  Specimens:  Sleeve gastrectomy  Local Anesthesia: 50 ml Exparel:0.5% Marcaine mix  Post-Op Plan:       Pain Management: PO, prn      Antibiotics: Prophylactic      Anticoagulation: Prophylactic, Starting now      Post Op Studies/Consults: Not applicable      Intended Discharge: within 48h      Intended Outpatient Follow-Up: Two Week      Intended Outpatient Studies: Not Applicable      Other: Not Applicable  Images:      De Blanch Haley Roza

## 2018-11-04 NOTE — Anesthesia Preprocedure Evaluation (Addendum)
Anesthesia Evaluation  Patient identified by MRN, date of birth, ID band Patient awake    Reviewed: Allergy & Precautions, NPO status , Patient's Chart, lab work & pertinent test results  Airway Mallampati: I  TM Distance: >3 FB Neck ROM: Full    Dental no notable dental hx. (+) Teeth Intact, Dental Advisory Given   Pulmonary asthma , sleep apnea and Continuous Positive Airway Pressure Ventilation ,    Pulmonary exam normal breath sounds clear to auscultation       Cardiovascular negative cardio ROS Normal cardiovascular exam Rhythm:Regular Rate:Normal     Neuro/Psych PSYCHIATRIC DISORDERS Anxiety Depression negative neurological ROS     GI/Hepatic Neg liver ROS, GERD  Medicated,  Endo/Other  Hypothyroidism Morbid obesity  Renal/GU negative Renal ROS  negative genitourinary   Musculoskeletal  (+) Fibromyalgia -  Abdominal   Peds  Hematology  (+) Blood dyscrasia, anemia ,   Anesthesia Other Findings   Reproductive/Obstetrics                            Anesthesia Physical Anesthesia Plan  ASA: III  Anesthesia Plan: General   Post-op Pain Management:    Induction: Intravenous  PONV Risk Score and Plan: 3 and Midazolam, Dexamethasone and Ondansetron  Airway Management Planned: Oral ETT  Additional Equipment:   Intra-op Plan:   Post-operative Plan: Extubation in OR  Informed Consent: I have reviewed the patients History and Physical, chart, labs and discussed the procedure including the risks, benefits and alternatives for the proposed anesthesia with the patient or authorized representative who has indicated his/her understanding and acceptance.   Dental advisory given  Plan Discussed with: CRNA  Anesthesia Plan Comments:         Anesthesia Quick Evaluation

## 2018-11-05 ENCOUNTER — Encounter (HOSPITAL_COMMUNITY): Payer: Self-pay | Admitting: General Surgery

## 2018-11-05 LAB — CBC WITH DIFFERENTIAL/PLATELET
Abs Immature Granulocytes: 0.02 10*3/uL (ref 0.00–0.07)
Basophils Absolute: 0 10*3/uL (ref 0.0–0.1)
Basophils Relative: 0 %
Eosinophils Absolute: 0 10*3/uL (ref 0.0–0.5)
Eosinophils Relative: 0 %
HCT: 40.2 % (ref 36.0–46.0)
Hemoglobin: 13 g/dL (ref 12.0–15.0)
Immature Granulocytes: 0 %
Lymphocytes Relative: 13 %
Lymphs Abs: 1.2 10*3/uL (ref 0.7–4.0)
MCH: 28.6 pg (ref 26.0–34.0)
MCHC: 32.3 g/dL (ref 30.0–36.0)
MCV: 88.4 fL (ref 80.0–100.0)
Monocytes Absolute: 0.6 10*3/uL (ref 0.1–1.0)
Monocytes Relative: 6 %
NEUTROS PCT: 81 %
Neutro Abs: 7.5 10*3/uL (ref 1.7–7.7)
Platelets: 258 10*3/uL (ref 150–400)
RBC: 4.55 MIL/uL (ref 3.87–5.11)
RDW: 13.4 % (ref 11.5–15.5)
WBC: 9.3 10*3/uL (ref 4.0–10.5)
nRBC: 0 % (ref 0.0–0.2)

## 2018-11-05 LAB — COMPREHENSIVE METABOLIC PANEL
ALT: 26 U/L (ref 0–44)
AST: 23 U/L (ref 15–41)
Albumin: 3.5 g/dL (ref 3.5–5.0)
Alkaline Phosphatase: 48 U/L (ref 38–126)
Anion gap: 11 (ref 5–15)
BUN: 7 mg/dL (ref 6–20)
CO2: 23 mmol/L (ref 22–32)
Calcium: 8.7 mg/dL — ABNORMAL LOW (ref 8.9–10.3)
Chloride: 106 mmol/L (ref 98–111)
Creatinine, Ser: 0.88 mg/dL (ref 0.44–1.00)
GFR calc non Af Amer: >60 mL/min (ref >60)
Glucose, Bld: 127 mg/dL — ABNORMAL HIGH (ref 70–99)
Potassium: 4.1 mmol/L (ref 3.5–5.1)
Sodium: 140 mmol/L (ref 135–145)
Total Bilirubin: 0.6 mg/dL (ref 0.3–1.2)
Total Protein: 6.1 g/dL — ABNORMAL LOW (ref 6.5–8.1)

## 2018-11-05 MED ORDER — TRAMADOL HCL 50 MG PO TABS: 50.0000 mg | ORAL_TABLET | Freq: Four times a day (QID) | ORAL | 0 refills | Status: AC | PRN

## 2018-11-05 MED ORDER — GABAPENTIN 300 MG PO CAPS: 300.0000 mg | ORAL_CAPSULE | Freq: Two times a day (BID) | ORAL | 0 refills | Status: AC

## 2018-11-05 MED ORDER — ONDANSETRON 4 MG PO TBDP: 4.0000 mg | ORAL_TABLET | Freq: Four times a day (QID) | ORAL | 0 refills | Status: AC | PRN

## 2018-11-05 NOTE — Progress Notes (Signed)
   Progress Note: Metabolic and Bariatric Surgery Service   Chief Complaint/Subjective: Pain moderate at left incision  Objective: Vital signs in last 24 hours: Temp:  [97.4 F (36.3 C)-98.6 F (37 C)] 98.1 F (36.7 C) (12/10 0521) Pulse Rate:  [56-103] 59 (12/10 0521) Resp:  [14-21] 15 (12/10 0521) BP: (107-143)/(62-97) 107/68 (12/10 0521) SpO2:  [94 %-100 %] 96 % (12/10 0521) Last BM Date: 11/03/18  Intake/Output from previous day: 12/09 0701 - 12/10 0700 In: 4002.1 [P.O.:690; I.V.:3162.1; IV Piggyback:150] Out: 2090 [Urine:2080; Blood:10] Intake/Output this shift: Total I/O In: 120 [P.O.:120] Out: -   Lungs: nonlabored breathing  Cardiovascular: RRR  Abd: soft, ATTP, incisions c/d/i  Extremities: no edema  Neuro: AOx4  Lab Results: CBC  Recent Labs    11/04/18 1239 11/05/18 0515  WBC 8.4 9.3  HGB 13.6 13.0  HCT 42.0 40.2  PLT 206 258   BMET Recent Labs    11/04/18 1239 11/05/18 0515  NA 142 140  K 4.2 4.1  CL 108 106  CO2 25 23  GLUCOSE 132* 127*  BUN 9 7  CREATININE 1.03* 0.88  CALCIUM 8.6* 8.7*   PT/INR No results for input(s): LABPROT, INR in the last 72 hours. ABG No results for input(s): PHART, HCO3 in the last 72 hours.  Invalid input(s): PCO2, PO2  Studies/Results:  Anti-infectives: Anti-infectives (From admission, onward)   Start     Dose/Rate Route Frequency Ordered Stop   11/04/18 0845  cefoTEtan (CEFOTAN) 2 g in sodium chloride 0.9 % 100 mL IVPB     2 g 200 mL/hr over 30 Minutes Intravenous On call to O.R. 11/04/18 [id number] 11/04/18 1048      Medications: Scheduled Meds: . acetaminophen (TYLENOL) oral liquid 160 mg/5 mL  650 mg Oral Q6H  . DULoxetine  60 mg Oral Daily  . enoxaparin (LOVENOX) injection  30 mg Subcutaneous Q12H  . gabapentin  200 mg Oral Q12H  . levothyroxine  112 mcg Oral Q0600  . protein supplement shake  2 oz Oral Q2H   Continuous Infusions: . dextrose 5 % and 0.45% NaCl 100 mL/hr at 11/05/18 0102    . famotidine (PEPCID) IV 20 mg (11/05/18 0929)   PRN Meds:.hydrALAZINE, morphine injection, ondansetron (ZOFRAN) IV, oxyCODONE, simethicone  Assessment/Plan: Patient Active Problem List   Diagnosis Date Noted  . Obesity 11/04/2018   s/p Procedure(s): LAPAROSCOPIC GASTRIC SLEEVE RESECTION WITH UPPER ENDO AND ERAS PATHWAY 11/04/2018 -possible discharge if able to meet fluid intake goals  Disposition:  LOS: 1 day  The patient will be in the hospital for normal postop protocol  Rodman PickleLuke Aaron Kinsinger, MD [phone] [facility]

## 2018-11-05 NOTE — Progress Notes (Signed)
Patient alert and oriented, pain is controlled. Patient is tolerating fluids, advanced to protein shake today, patient is tolerating well.  Reviewed Gastric sleeve discharge instructions with patient and patient is able to articulate understanding.  Provided information on BELT program, Support Group and [facility] outpatient pharmacy. All questions answered, will continue to monitor.  Total fluid intake 930 Per dehydration protocol call back one week post op 

## 2018-11-05 NOTE — Progress Notes (Signed)
Patient alert and oriented, Post op day 1.  Provided support and encouragement.  Encouraged pulmonary toilet, ambulation and small sips of liquids.  Completed 2 ounces of protein shake.  All questions answered.  Will continue to monitor.

## 2018-11-05 NOTE — Discharge Summary (Signed)
Physician Discharge Summary  Michelle Daniel ZOX:[id number] DOB: 1986-05-31 DOA: 11/04/2018  PCP: Cecile Sheerer, NP  Admit date: 11/04/2018 Discharge date: 11/05/2018  Recommendations for Outpatient Follow-up:  1.  (include homehealth, outpatient follow-up instructions, specific recommendations for PCP to follow-up on, etc.)  Follow-up Information    Brittane Grudzinski, De Blanch, MD. Go on 11/29/2018.   Specialty:  General Surgery Why:  at [address] Contact information: [address] [place] [address] [phone]        Yaniel Limbaugh, De Blanch, MD .   Specialty:  General Surgery Contact information: [address] [place] [address] [place] [address] [phone]          Discharge Diagnoses:  Active Problems:   Obesity   Surgical Procedure: laparoscopic sleeve gastrectomy, upper endoscopy  Discharge Condition: Good Disposition: Home  Diet recommendation: Postoperative sleeve gastrectomy diet (liquids only)  Filed Weights   11/04/18 0900  Weight: 125.6 kg     Hospital Course:  The patient was admitted after undergoing laparoscopic sleeve gastrectomy. POD 0 she ambulated well. POD 1 she was started on the water diet protocol and tolerated 300 ml in the first shift. Once meeting the water amount she was advanced to bariatric protein shakes which they tolerated and were discharged home POD 1.  Treatments: surgery: laparoscopic sleeve gastrectomy  Discharge Instructions  Discharge Instructions    Ambulate hourly while awake   Complete by:  As directed    Call MD for:  difficulty breathing, headache or visual disturbances   Complete by:  As directed    Call MD for:  persistant dizziness or light-headedness   Complete by:  As directed    Call MD for:  persistant nausea and vomiting   Complete by:  As directed    Call MD for:  redness, tenderness, or signs of infection (pain, swelling, redness, odor or green/yellow discharge around incision site)   Complete by:   As directed    Call MD for:  severe uncontrolled pain   Complete by:  As directed    Call MD for:  temperature >101 F   Complete by:  As directed    Diet bariatric full liquid   Complete by:  As directed    Discharge wound care:   Complete by:  As directed    Remove Bandaids tomorrow, ok to shower tomorrow. Steristrips may fall off in 1-3 weeks.   Incentive spirometry   Complete by:  As directed    Perform hourly while awake     Allergies as of 11/05/2018      Reactions   Sumatriptan Anaphylaxis   unknown      Medication List    STOP taking these medications   amoxicillin 500 MG tablet Commonly known as:  AMOXIL     TAKE these medications   CALCIUM + D PO Take 1 tablet by mouth daily.   DULoxetine 60 MG capsule Commonly known as:  CYMBALTA Take 60 mg by mouth daily.   gabapentin 300 MG capsule Commonly known as:  NEURONTIN Take 1 capsule (300 mg total) by mouth 2 (two) times daily.   levothyroxine 112 MCG tablet Commonly known as:  SYNTHROID, LEVOTHROID Take 112 mcg by mouth daily before breakfast.   multivitamin with minerals tablet Take 1 tablet by mouth daily.   ondansetron 4 MG disintegrating tablet Commonly known as:  ZOFRAN-ODT Take 1 tablet (4 mg total) by mouth every 6 (six) hours as needed for nausea or vomiting.  pantoprazole 40 MG tablet Commonly known as:  PROTONIX Take 40 mg by mouth 2 (two) times daily.   traMADol 50 MG tablet Commonly known as:  ULTRAM Take 1 tablet (50 mg total) by mouth every 6 (six) hours as needed (pain).            Discharge Care Instructions  (From admission, onward)         Start     Ordered   11/05/18 0000  Discharge wound care:    Comments:  Remove Bandaids tomorrow, ok to shower tomorrow. Steristrips may fall off in 1-3 weeks.   11/05/18 1135         Follow-up Information    Saleen Peden, De BlanchLuke Aaron, MD. Go on 11/29/2018.   Specialty:  General Surgery Why:  at [address] Contact information: [address] [place] [address] [phone]        Peityn Payton, De BlanchLuke Aaron, MD .   Specialty:  General Surgery Contact information: [address] [place] [address] [phone]            The results of significant diagnostics from this hospitalization (including imaging, microbiology, ancillary and laboratory) are listed below for reference.    Significant Diagnostic Studies: No results found.  Labs: Basic Metabolic Panel: Recent Labs  Lab 10/30/18 1054 11/04/18 1239 11/05/18 0515  NA 140 142 140  K 4.2 4.2 4.1  CL 104 108 106  CO2 27 25 23   GLUCOSE 94 132* 127*  BUN 12 9 7   CREATININE 0.95 1.03* 0.88  CALCIUM 9.2 8.6* 8.7*   Liver Function Tests: Recent Labs  Lab 11/04/18 1239 11/05/18 0515  AST 26 23  ALT 30 26  ALKPHOS 57 48  BILITOT 0.6 0.6  PROT 6.3* 6.1*  ALBUMIN 3.9 3.5    CBC: Recent Labs  Lab 10/30/18 1054 11/04/18 1239 11/05/18 0515  WBC 7.6 8.4 9.3  NEUTROABS  --  6.6 7.5  HGB 14.5 13.6 13.0  HCT 43.8 42.0 40.2  MCV 88.0 89.7 88.4  PLT 277 206 258    CBG: No results for input(s): GLUCAP in the last 168 hours.  Active Problems:   Obesity   VTE plan: no chemical prophylaxis recommended ([email or web address]/)  Time coordinating discharge: 15min

## 2018-11-11 ENCOUNTER — Telehealth (HOSPITAL_COMMUNITY): Payer: Self-pay

## 2018-11-11 ENCOUNTER — Telehealth: Payer: Self-pay | Admitting: Skilled Nursing Facility1

## 2018-11-11 NOTE — Telephone Encounter (Signed)
Dietitian returned pts call   LVM 

## 2018-11-11 NOTE — Telephone Encounter (Signed)
Left message to discuss post bariatric surgery follow up questions.  See below:   1.  Tell me about your pain and pain management?  2.  Let's talk about fluid intake.  How much total fluid are you taking in?  3.  How much protein have you taken in the last 2 days?  4.  Have you had nausea?  Tell me about when have experienced nausea and what you did to help?  5.  Has the frequency or color changed with your urine?  6.  Tell me what your incisions look like?  7.  Have you been passing gas? BM?  8.  If a problem or question were to arise who would you call?  Do you know contact numbers for [facility], [facility], and [facility]?  9.  How has the walking going?  10.  How are your vitamins and calcium going?  How are you taking them? 

## 2018-11-11 NOTE — Telephone Encounter (Signed)
Dietitian returned pts missed call.  Pt states she is getting in about 50 ounces of fluid but is struggling to meet her protein needs due to an intolerance to sweet protein shakes. Pt states she can tolerate pudding with PB2 and yogurt with 12 gram protein white milk ad sugar free popcicles and sugar free jello. Pt states she cannot tolerate broth.  Pt was advised to add unflavored protein powder to her sugar free popcicle.   Dietitian is holding sante fe flavored protein powder, unflavored, and chicken soup for the pt to come get to try.

## 2018-11-18 ENCOUNTER — Encounter: Payer: Self-pay | Admitting: Skilled Nursing Facility1

## 2018-11-18 ENCOUNTER — Encounter: Payer: 59 | Attending: General Surgery | Admitting: Skilled Nursing Facility1

## 2018-11-18 DIAGNOSIS — E282 Polycystic ovarian syndrome: Secondary | ICD-10-CM | POA: Diagnosis not present

## 2018-11-18 DIAGNOSIS — Z713 Dietary counseling and surveillance: Secondary | ICD-10-CM | POA: Insufficient documentation

## 2018-11-18 DIAGNOSIS — G4733 Obstructive sleep apnea (adult) (pediatric): Secondary | ICD-10-CM | POA: Insufficient documentation

## 2018-11-18 DIAGNOSIS — E039 Hypothyroidism, unspecified: Secondary | ICD-10-CM | POA: Insufficient documentation

## 2018-11-18 DIAGNOSIS — G43909 Migraine, unspecified, not intractable, without status migrainosus: Secondary | ICD-10-CM | POA: Diagnosis not present

## 2018-11-18 DIAGNOSIS — Z6841 Body Mass Index (BMI) 40.0 and over, adult: Secondary | ICD-10-CM

## 2018-11-18 NOTE — Progress Notes (Signed)
Bariatric Class:  Appt start time: 1530 end time:  1630.  2 Week Post-Operative Nutrition Class  Patient was seen on 11/18/2018 for Post-Operative Nutrition education at [facility].   Surgery date: 11/04/2018 Surgery type: sleeve Start weight at [facility]: 294.6 Weight today: pt declined  TANITA  BODY COMP RESULTS  Pt declined   BMI (kg/m^2)    Fat Mass (lbs)    Fat Free Mass (lbs)    Total Body Water (lbs)    The following the learning objectives were met by the patient during this course:  Identifies Phase 3A (Soft, High Proteins) Dietary Goals and will begin from 2 weeks post-operatively to 2 months post-operatively  Identifies appropriate sources of fluids and proteins   States protein recommendations and appropriate sources post-operatively  Identifies the need for appropriate texture modifications, mastication, and bite sizes when consuming solids  Identifies appropriate multivitamin and calcium sources post-operatively  Describes the need for physical activity post-operatively and will follow MD recommendations  States when to call healthcare provider regarding medication questions or post-operative complications  Handouts given during class include:  Phase 3A: Soft, High Protein Diet Handout  Follow-Up Plan: Patient will follow-up at [facility] in 6 weeks for 2 month post-op nutrition visit for diet advancement per MD.

## 2018-11-25 ENCOUNTER — Telehealth: Payer: Self-pay | Admitting: Skilled Nursing Facility1

## 2018-11-25 NOTE — Telephone Encounter (Signed)
RD called pt to verify fluid intake once starting soft, solid proteins 2 week post-bariatric surgery.   Daily Fluid intake: 64+ Daily Protein intake: 60+  Concerns/issues:  None 

## 2018-12-05 ENCOUNTER — Ambulatory Visit: Payer: Self-pay | Admitting: Skilled Nursing Facility1

## 2019-01-03 ENCOUNTER — Ambulatory Visit: Payer: Self-pay | Admitting: Dietician

## 2019-10-26 IMAGING — RF DG UGI W/ KUB
11 series · 15 of 16 positions shown · non-contrast
Comparison: None.

CLINICAL DATA: Preop upper GI prior to bariatric surgery. Patient
with no gastrointestinal complaints.

EXAM:
UPPER GI SERIES WITH KUB
TECHNIQUE: After obtaining a scout radiograph a routine upper GI series was
performed using thin density barium.
FLUOROSCOPY TIME:  Fluoroscopy Time:  1 minutes and 30 seconds
Radiation Exposure Index (if provided by the fluoroscopic device):
104.5 mGy
Number of Acquired Spot Images: 10

[Series 1: t abdomen supine · 0.15mm/px · 1 of 1 slices shown]
[im 1/1]
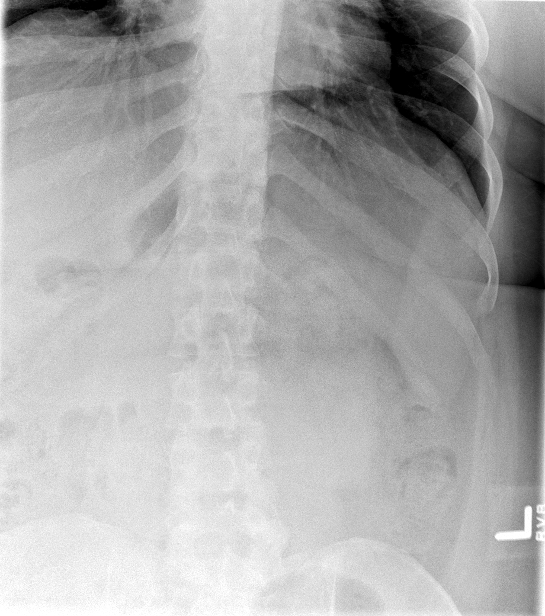

[Series 2: fluoro_barium singleshot_bw · 0.17mm/px · 1 of 1 slices shown (1 of 4)]
[im 1/1]
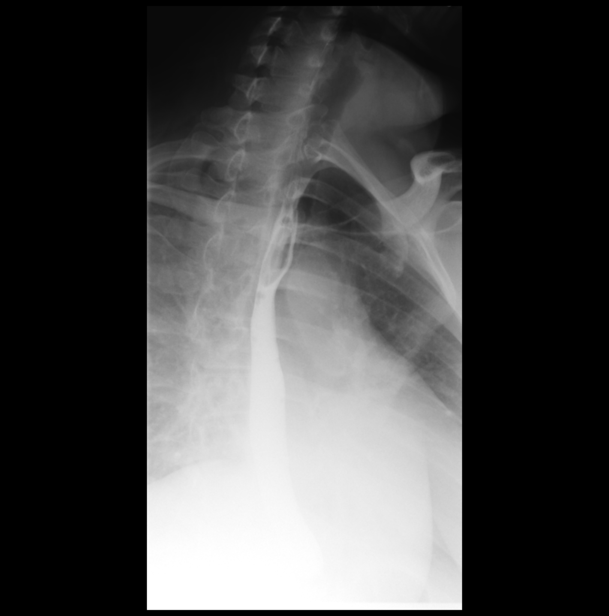

[Series 3: fluoro_barium singleshot_bw · 0.17mm/px · 1 of 1 slices shown (2 of 4)]
[im 1/1]
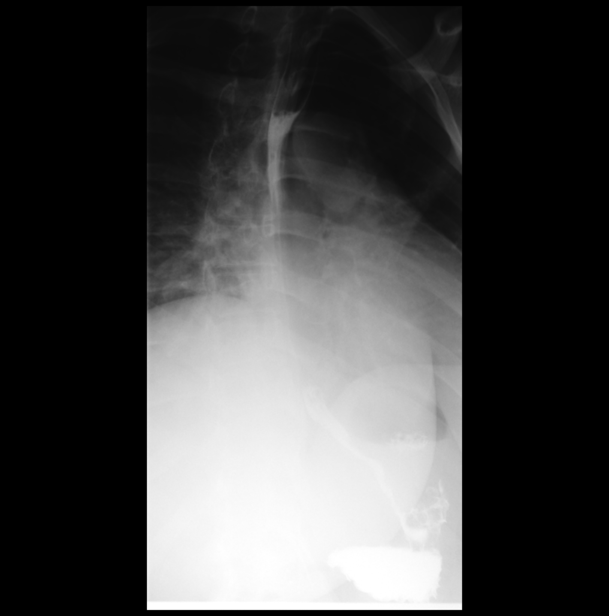

[Series 4: fluoro_barium singleshot_bw · 0.17mm/px · 1 of 1 slices shown (3 of 4)]
[im 1/1]
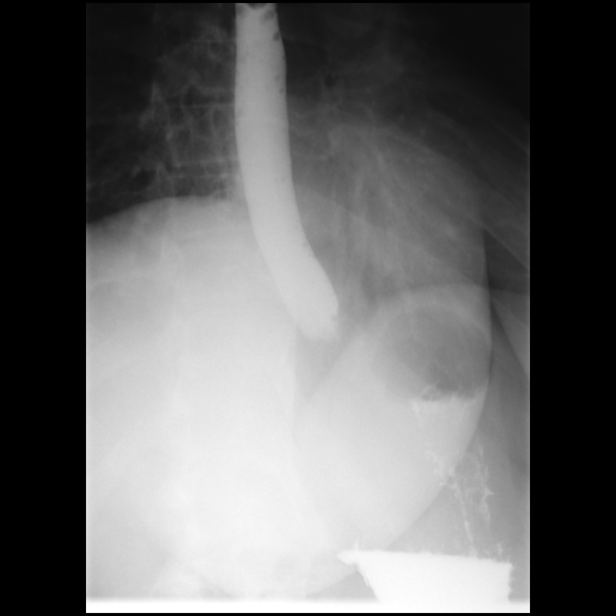

[Series 5: fluoro_barium singleshot_bw · 0.17mm/px · 1 of 1 slices shown (4 of 4)]
[im 1/1]
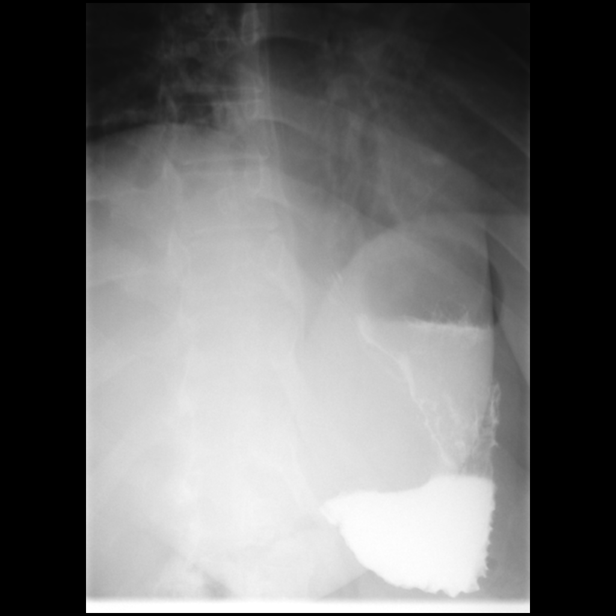

[Series 6: fluoro_barium 2fps_bw · 0.18mm/px · 2 of 5 frames shown (1 of 6)]
[frame 1/5]
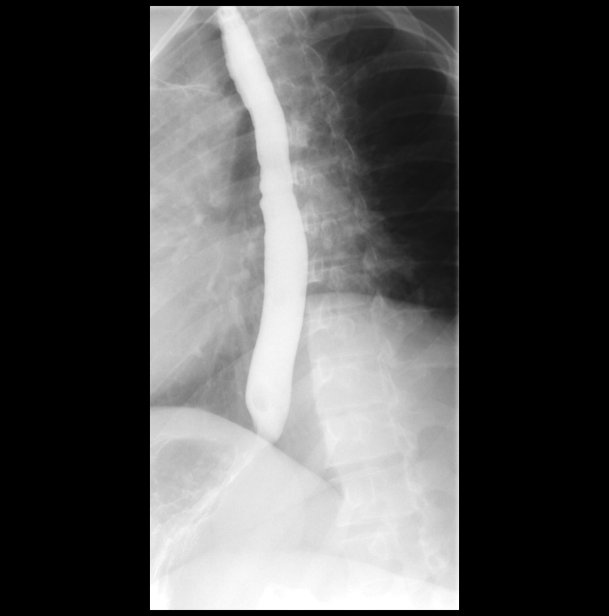
[frame 3/5]
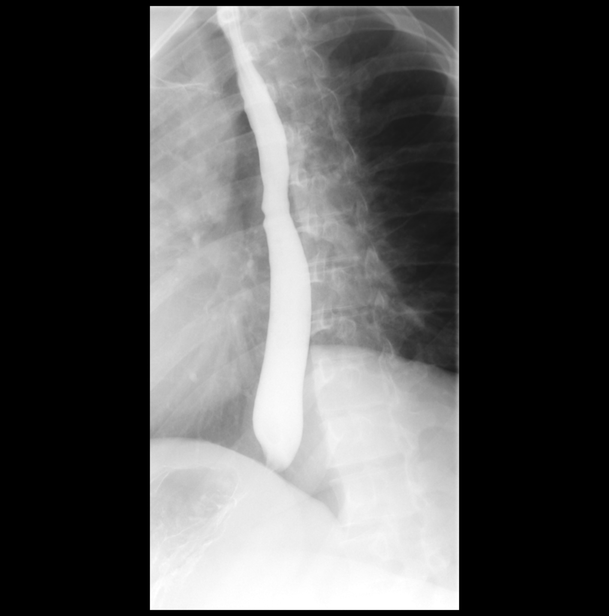

[Series 7: fluoro_barium 2fps_bw · 0.18mm/px · 1 of 1 slices shown (2 of 6)]
[im 1/1]
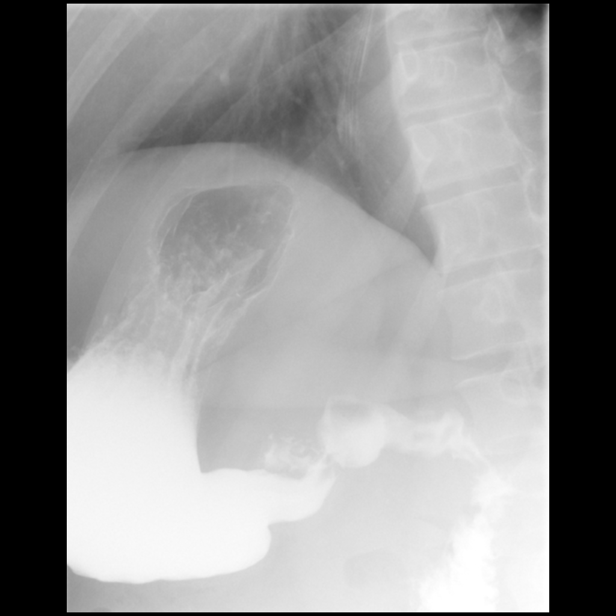

[Series 8: fluoro_barium 2fps_bw · 0.18mm/px · 1 of 1 slices shown (3 of 6)]
[im 1/1]
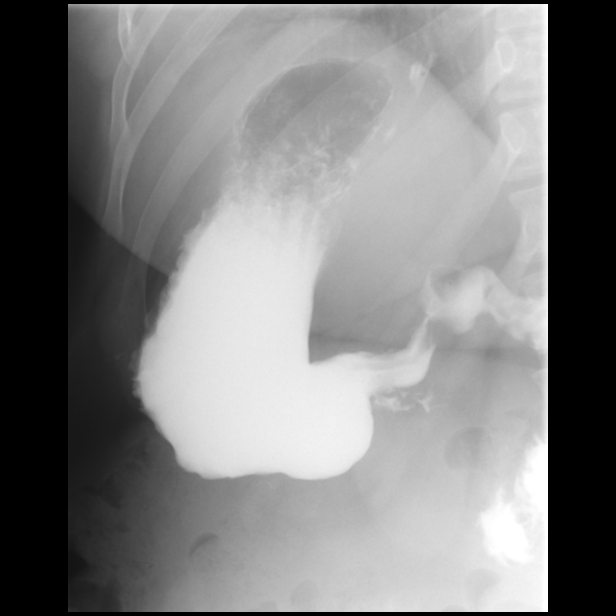

[Series 9: fluoro_barium 2fps_bw · 0.19mm/px · 3 of 6 frames shown (4 of 6)]
[frame 1/6]
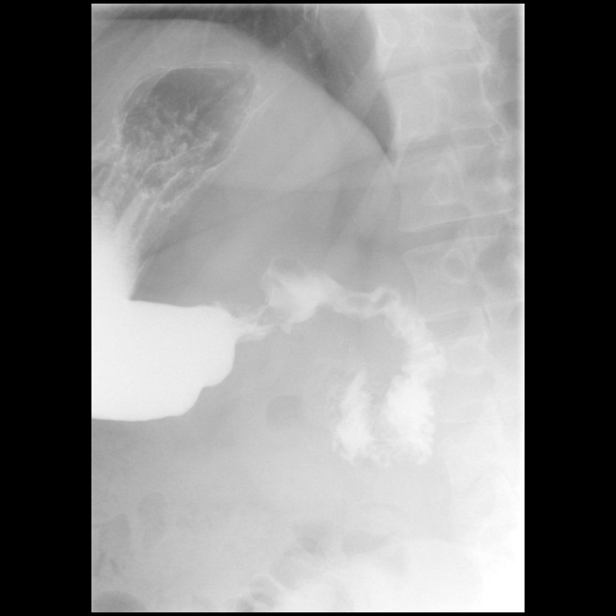
[frame 4/6]
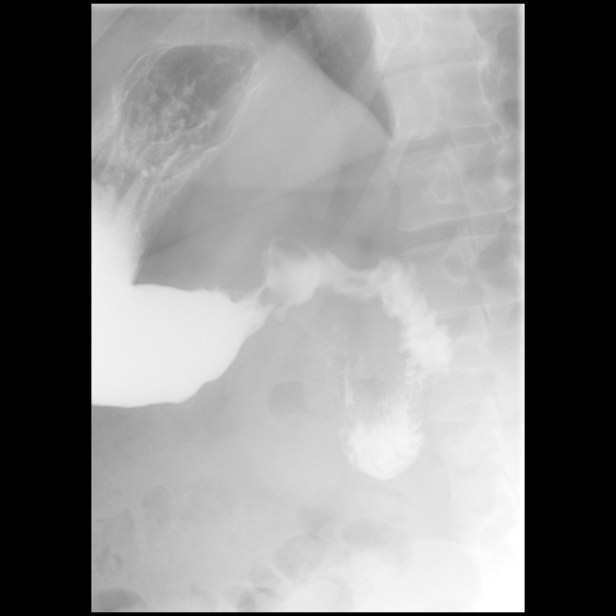
[frame 6/6]
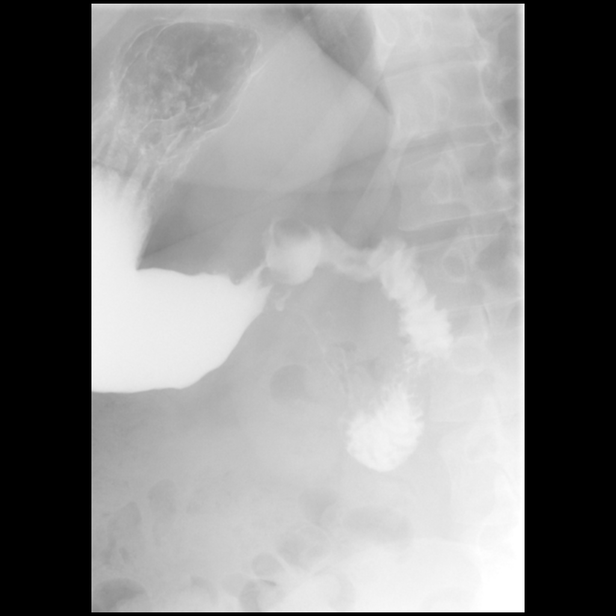

[Series 10: fluoro_barium 2fps_bw · 0.18mm/px · 1 of 1 slices shown (5 of 6)]
[im 1/1]
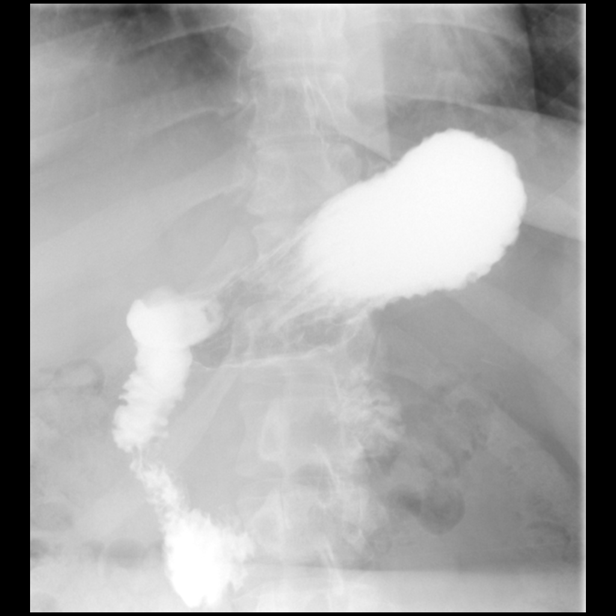

[Series 11: fluoro_barium 2fps_bw · 0.18mm/px · 2 of 2 frames shown (6 of 6)]
[frame 1/2]
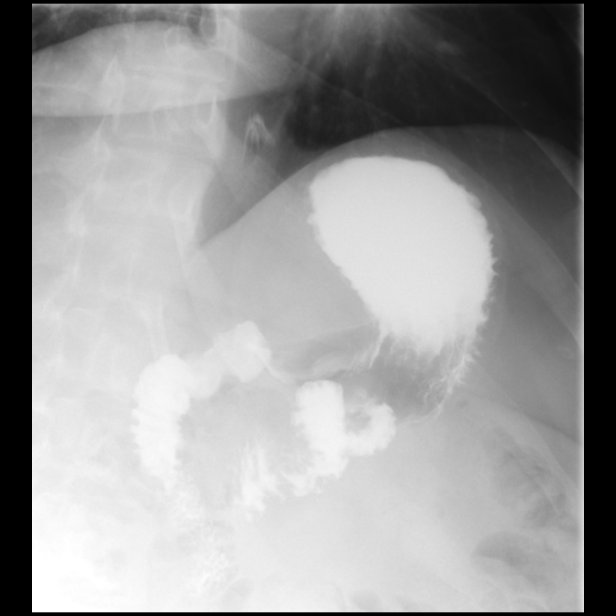
[frame 2/2]
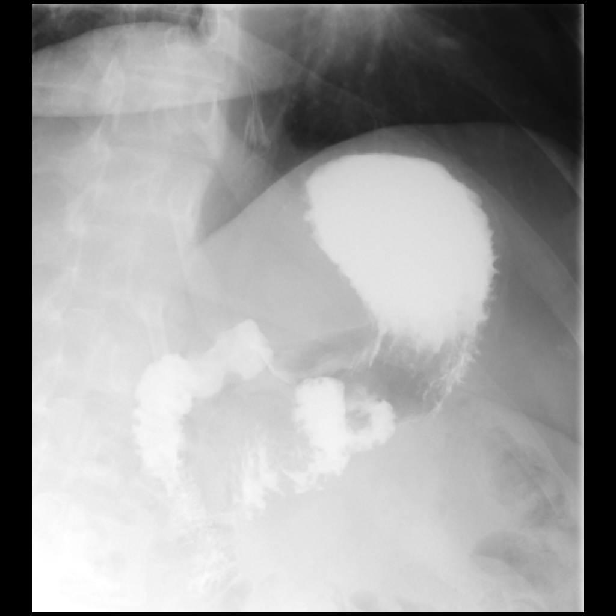

[15 of 16 positions shown; findings below may reference images not displayed]

FINDINGS: Initial scout abdomen radiograph demonstrates a normal bowel gas
pattern. No soft tissue abnormality.

Esophagus is normal course and in caliber with no mucosal
abnormality. No reflux was documented during the exam. Motility is
within normal limits.

Stomach is normal in size, orientation and position. No mucosal
abnormality.

Duodenal bulb and C sweep are unremarkable.
IMPRESSION: Normal single contrast upper GI study.

## 2020-06-02 ENCOUNTER — Encounter (HOSPITAL_COMMUNITY): Payer: Self-pay

## 2021-06-02 ENCOUNTER — Encounter (HOSPITAL_COMMUNITY): Payer: Self-pay | Admitting: *Deleted

## 2022-06-02 ENCOUNTER — Encounter (HOSPITAL_COMMUNITY): Payer: Self-pay | Admitting: *Deleted
